# Patient Record
Sex: Male | Born: 1990
Health system: Southern US, Community
[De-identification: ages and names within clinical notes are randomized; demographics above are authoritative.]

## PROBLEM LIST (undated history)

## (undated) DIAGNOSIS — F429 Obsessive-compulsive disorder, unspecified: Secondary | ICD-10-CM

## (undated) DIAGNOSIS — S82209A Unspecified fracture of shaft of unspecified tibia, initial encounter for closed fracture: Secondary | ICD-10-CM

## (undated) DIAGNOSIS — F329 Major depressive disorder, single episode, unspecified: Secondary | ICD-10-CM

## (undated) DIAGNOSIS — F909 Attention-deficit hyperactivity disorder, unspecified type: Secondary | ICD-10-CM

## (undated) DIAGNOSIS — F419 Anxiety disorder, unspecified: Secondary | ICD-10-CM

## (undated) DIAGNOSIS — F319 Bipolar disorder, unspecified: Secondary | ICD-10-CM

## (undated) DIAGNOSIS — F32A Depression, unspecified: Secondary | ICD-10-CM

## (undated) HISTORY — DX: Anxiety disorder, unspecified: F41.9

## (undated) HISTORY — PX: NASAL SEPTUM SURGERY: SHX37

## (undated) HISTORY — DX: Obsessive-compulsive disorder, unspecified: F42.9

## (undated) HISTORY — DX: Bipolar disorder, unspecified: F31.9

## (undated) HISTORY — PX: EYE SURGERY: SHX253

## (undated) HISTORY — PX: APPENDECTOMY: SHX54

## (undated) HISTORY — DX: Depression, unspecified: F32.A

## (undated) HISTORY — DX: Major depressive disorder, single episode, unspecified: F32.9

## (undated) HISTORY — DX: Unspecified fracture of shaft of unspecified tibia, initial encounter for closed fracture: S82.209A

## (undated) HISTORY — DX: Attention-deficit hyperactivity disorder, unspecified type: F90.9

---
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
## 2008-02-12 ENCOUNTER — Telehealth: Payer: Self-pay | Admitting: Family Medicine

## 2008-02-20 ENCOUNTER — Ambulatory Visit: Payer: Self-pay | Admitting: Family Medicine

## 2008-03-11 ENCOUNTER — Encounter: Payer: Self-pay | Admitting: Family Medicine

## 2008-03-12 ENCOUNTER — Encounter: Payer: Self-pay | Admitting: Family Medicine

## 2008-04-07 IMAGING — CR DG FOREARM 2V*L*
2 series · 2 of 2 positions shown · non-contrast
Comparison: none

CLINICAL DATA: Left arm laceration

LEFT FOREARM - 2  VIEW:

[x forearm ap left]
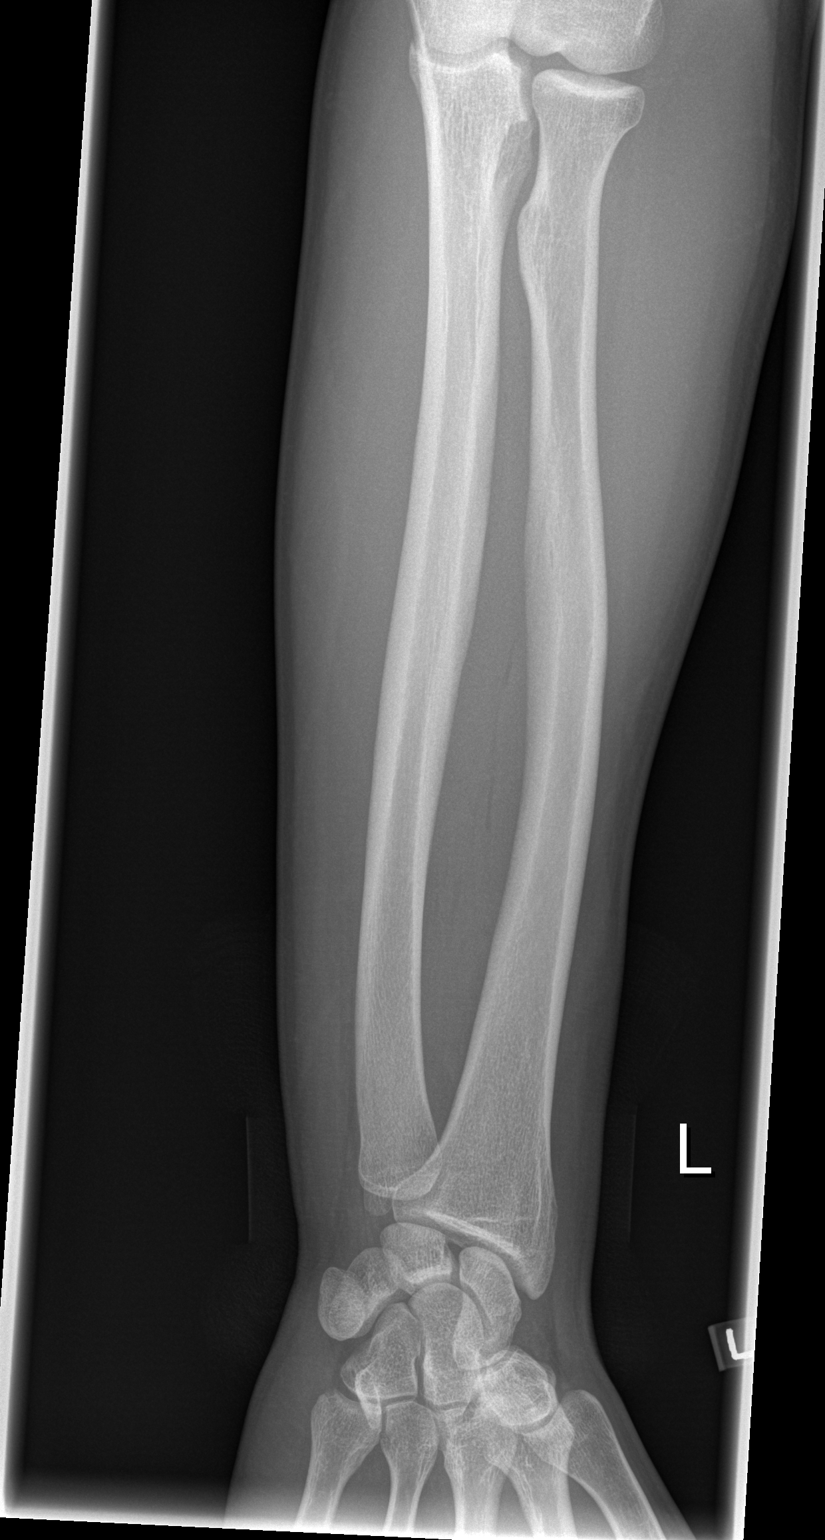

[x forearm lat left]
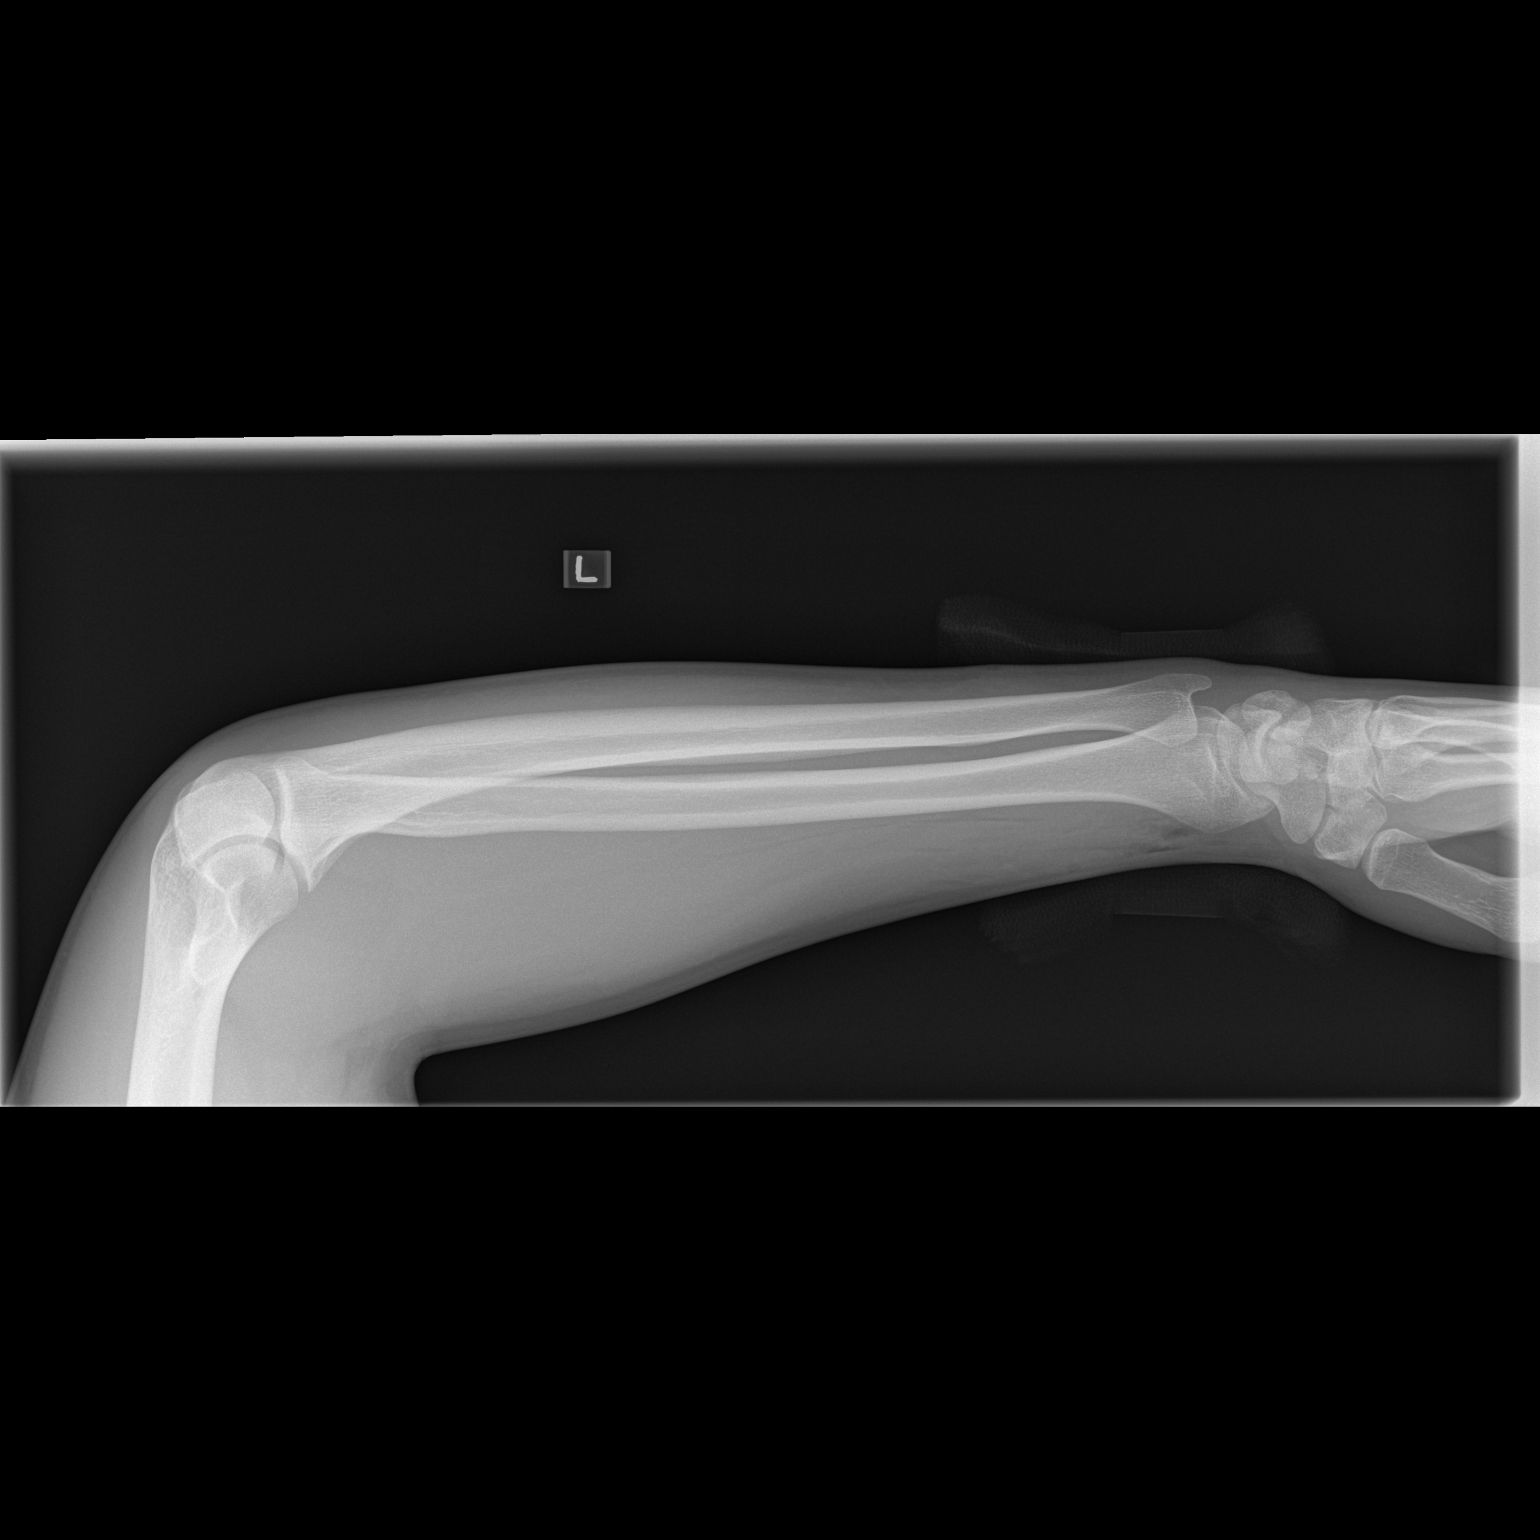

[2 of 2 positions shown; findings below may reference images not displayed]

FINDINGS: There is no evidence of fracture or other focal bone lesions.  Soft
tissues are unremarkable.
IMPRESSION: Negative.

## 2008-05-15 ENCOUNTER — Telehealth: Payer: Self-pay | Admitting: Family Medicine

## 2008-05-20 ENCOUNTER — Ambulatory Visit: Payer: Self-pay | Admitting: Family Medicine

## 2008-05-20 DIAGNOSIS — F528 Other sexual dysfunction not due to a substance or known physiological condition: Secondary | ICD-10-CM | POA: Insufficient documentation

## 2008-09-04 ENCOUNTER — Ambulatory Visit: Payer: Self-pay | Admitting: Family Medicine

## 2009-02-18 ENCOUNTER — Ambulatory Visit (HOSPITAL_BASED_OUTPATIENT_CLINIC_OR_DEPARTMENT_OTHER): Admission: RE | Admit: 2009-02-18 | Discharge: 2009-02-18 | Payer: Self-pay | Admitting: Otolaryngology

## 2009-05-15 ENCOUNTER — Ambulatory Visit: Payer: Self-pay | Admitting: Family Medicine

## 2009-05-15 DIAGNOSIS — R5383 Other fatigue: Secondary | ICD-10-CM

## 2009-05-15 DIAGNOSIS — R5381 Other malaise: Secondary | ICD-10-CM | POA: Insufficient documentation

## 2009-05-16 LAB — CONVERTED CEMR LAB
BUN: 15 mg/dL (ref 6–23)
Basophils Absolute: 0 10*3/uL (ref 0.0–0.1)
Bilirubin, Direct: 0 mg/dL (ref 0.0–0.3)
Chloride: 105 meq/L (ref 96–112)
Creatinine, Ser: 1 mg/dL (ref 0.4–1.5)
EBV VCA IgG: 2.3 — ABNORMAL HIGH
EBV VCA IgM: 0.24
GFR calc non Af Amer: 101.27 mL/min (ref >60)
Glucose, Bld: 81 mg/dL (ref 70–99)
HCT: 42.2 % (ref 39.0–52.0)
Lymphs Abs: 1.9 10*3/uL (ref 0.7–4.0)
MCV: 93.5 fL (ref 78.0–100.0)
Monocytes Absolute: 0.6 10*3/uL (ref 0.1–1.0)
Neutrophils Relative %: 50.4 % (ref 43.0–77.0)
Platelets: 248 10*3/uL (ref 150.0–400.0)
Potassium: 4.3 meq/L (ref 3.5–5.1)
RDW: 12.1 % (ref 11.5–14.6)
TSH: 2.95 microintl units/mL (ref 0.35–5.50)
Total Bilirubin: 0.9 mg/dL (ref 0.3–1.2)

## 2010-09-22 ENCOUNTER — Telehealth: Payer: Self-pay | Admitting: Family Medicine

## 2010-09-24 ENCOUNTER — Ambulatory Visit: Payer: Self-pay | Admitting: Family Medicine

## 2010-11-12 NOTE — Progress Notes (Signed)
  Phone Note Call from Patient Call back at Home Phone [phone]   Caller: Dad Call For: Nelwyn Salisbury MD Summary of Call: Pt had ER appendectomy in [place] on Sep 13, 2010.  Did not  like the level of care and flew home on the 11th.  Still has staples in incision, and wants to know who can take the staples out? Initial call taken by: [facility] CMA AAMA,  September 22, 2010 3:10 PM  Follow-up for Phone Call        he would need to see someone in Surgery here. Please set up a referral ASAP  Follow-up by: Nelwyn Salisbury MD,  September 23, 2010 11:11 AM  New Problems: ENCOUNTER FOR REMOVAL OF SUTURES (ICD-V58.32)   New Problems: ENCOUNTER FOR REMOVAL OF SUTURES (ICD-V58.32)  Appended Document:  Attempted to make contact with pts mom (who called for appt) at [phone]  and  home # for pt  [phone].... Lft msg at both #'s advising them to c/b so that they can be advised to await phone call from Terri, [facility] Referral Coordinator... She will call once surgery appt (for removal of sutures) is scheduled.

## 2011-01-19 LAB — POCT HEMOGLOBIN-HEMACUE: Hemoglobin: 16 g/dL (ref 13.0–17.0)

## 2011-02-23 NOTE — Op Note (Signed)
Mitchell Stone, Mitchell Stone            ACCOUNT NO.:  [id number]   MEDICAL RECORD NO.:  [id number]          PATIENT TYPE:  AMB   LOCATION:  DSC                          FACILITY:  [facility]   PHYSICIAN:  Christopher E. Ezzard Standing, M.D.DATE OF BIRTH:  05-24-1991   DATE OF PROCEDURE:  02/18/2009  DATE OF DISCHARGE:  02/18/2009                               OPERATIVE REPORT   PREOPERATIVE DIAGNOSIS:  Septal deviation to the right with right-sided  nasal obstruction, turbinate hypertrophy.   POSTOPERATIVE DIAGNOSIS:  Septal deviation to the right with right-sided  nasal obstruction, turbinate hypertrophy.   OPERATION PERFORMED:  Septoplasty with bilateral inferior turbinate  reductions.   SURGEON:  Kristine Garbe. Ezzard Standing, MD   ANESTHESIA:  General endotracheal.   COMPLICATIONS:  None.   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  On examination in the office, he has a fairly severe right-sided  anterior cartilaginous septal deviation with almost total blockage of  the right nasal airway.  He had moderate turbinate hypertrophy.  He also  has a small subtle deformity of the nose anteriorly over the  cartilaginous septum.  Nasal bones are fairly well lined, but may be  slightly depressed.  His main problem really seems to the nasal  obstruction and was taken to operating room at this time for septoplasty  and turbinate ductions to help improve his right nasal airway.   DESCRIPTION OF PROCEDURE:  After adequate endotracheal anesthesia, nose  was prepped with Betadine solution and draped with sterile towels.  The  nose was then further prepped with cotton pledgets soaked in Afrin and  septum and turbinates were injected with Xylocaine with epinephrine.  Hemitransfixion incision was made along the caudal edge of the septum  on  the right side.  Mucoperiosteal flaps were elevated posteriorly.  He has  a fair amount of scar tissue on the right side where the septum actually  bowed off the crest into the right airway and was buckled with the  surrounding scar tissue.  The cartilaginous septum was almost touched  the inferior turbinate on the right side.  At this area, the  mucoperichondrium was elevated off either side of the severely deviated  cartilaginous septum.  The anterior 2 cm of septum was preserved and  this cartilaginous septum was removed.  More superiorly, the bony septum  of the vomer also protruded to the right side and after elevating  mucoperichondrium on either side, the bony superior septum which was  thickened and deviated to right was removed with Jansen-Middleton  forceps.  This significantly improve the right airway.  At completion of  the septoplasty, inferior turbinate reductions were performed.  On the  right side, inferior lateral one half of turbinate was amputated and  remaining turbinate tissue was outfractured and cauterized.  On the left  side, incision was made along the inferior edge of the septum and  submucosal resection of bone was performed and  the submucosal  cauterization was performed and then the remaining posterior turbinate  was outfractured.  This completed the procedure.  The hemitransfixion  incision was closed with interrupted 4-0 chromic suture.  The septum was  basted with a 4-0 chromic suture and then splints were secured at either  side of the septum with a single 3-0 nylon suture.  Nose was packed with  Telfa soaked in bacitracin ointment.  Mitchell Stone was then awoke from  anesthesia and transferred to the recovery room and postop doing well.   DISPOSITION:  Mitchell Stone is discharged home later this morning on Keflex 500  mg b.i.d. for 10 days, Tylenol and Vicodin p.r.n. pain.  We will have  him follow up in my office in 2 days to have nasal packs removed.            ______________________________  Kristine Garbe Ezzard Standing, M.D.     CEN/MEDQ  D:  02/18/2009  T:  02/19/2009  Job:  [id number]

## 2011-02-26 NOTE — Assessment & Plan Note (Signed)
[facility] OFFICE NOTE   Mitchell Stone, Mitchell Stone                     MRN:          [id number]  DATE:08/24/2006                            DOB:          1991/04/23    This is a 20 year old gentleman here with his father to establish with  our practice.  He is complaining of an upper respiratory infection.  For  the past two weeks, he has had stuffy head, sinus pressure, postnasal  drainage, chest congestion and a dry cough.  No shortness of breath and  no fever.  He has been drinking fluids and using over-the-counter cough  medicines.   PAST MEDICAL HISTORY:  He had seen Dr. Ermalinda Barrios for pediatric  care before transferring to [place].  He had an unremarkable birth history.  He has never had a surgery.  His only trauma was a fracture to his right  tibia, which healed with simple casting.  He has been seeing Dr.  Marlyne Beards for the past three years for obsessive-compulsive disorder, and  apparently he is doing quite well on his current medication.   IMMUNIZATIONS:  Up to date.  He did have a tetanus booster in 2005.   HABITS:  He denies using tobacco or alcohol.   SOCIAL HISTORY:  He lives with his parents and his two sisters.  He is  in the 11th grade at [employer].   FAMILY HISTORY:  Remarkable for heart disease and asthma.   OBJECTIVE:  VITAL SIGNS:  Height 6 feet 1/2 inch.  Weight 183.  BP  132/74, pulse 76 and regular, temperature 97.5 degrees.  GENERAL:  He is in no acute distress.  HEENT:  Eyes clear.  Ears clear.  Pharynx clear.  NECK:  Supple without lymphadenopathy or masses.  LUNGS:  Clear.   ASSESSMENT/PLAN:  Bronchitis.   Biaxin XL 500 mg 2 tablets a day for 10 days plus Robitussin-DM as  needed.     Tera Mater. Clent Ridges, MD  Electronically Signed    SAF/MedQ  DD: 08/25/2006  DT: 08/25/2006  Job #: [id number]

## 2011-03-15 ENCOUNTER — Ambulatory Visit (INDEPENDENT_AMBULATORY_CARE_PROVIDER_SITE_OTHER): Payer: BC Managed Care – PPO | Admitting: Family Medicine

## 2011-03-15 ENCOUNTER — Encounter: Payer: Self-pay | Admitting: Family Medicine

## 2011-03-15 VITALS — BP 122/80 | HR 76 | Temp 98.4°F | Ht 74.5 in | Wt 187.0 lb

## 2011-03-15 DIAGNOSIS — A088 Other specified intestinal infections: Secondary | ICD-10-CM

## 2011-03-15 DIAGNOSIS — A084 Viral intestinal infection, unspecified: Secondary | ICD-10-CM

## 2011-03-15 NOTE — Progress Notes (Signed)
  Subjective:    Patient ID: Mitchell Stone, male    DOB: November 28, 1990, 20 y.o.   MRN: [id number]  HPI Here for 2 days of mild abdominal cramps and diarrhea. No fever. No nausea or vomiting. No urinary symptoms. No recent antibiotic use, no recent travel.    Review of Systems  Constitutional: Negative.   Respiratory: Negative.   Cardiovascular: Negative.   Gastrointestinal: Positive for abdominal pain and diarrhea. Negative for nausea, vomiting, constipation, blood in stool, abdominal distention, anal bleeding and rectal pain.  Genitourinary: Negative.        Objective:   Physical Exam  Constitutional: He appears well-developed and well-nourished.  Cardiovascular: Normal rate, regular rhythm, normal heart sounds and intact distal pulses.   Pulmonary/Chest: Effort normal and breath sounds normal.  Abdominal: Soft. Bowel sounds are normal. He exhibits no distension and no mass. There is no tenderness. There is no rebound and no guarding.          Assessment & Plan:  This appears to be viral. Drink water and Gatorade. Try Imodium AD prn .

## 2011-08-16 ENCOUNTER — Encounter: Payer: Self-pay | Admitting: Internal Medicine

## 2011-09-13 ENCOUNTER — Encounter: Payer: Self-pay | Admitting: Internal Medicine

## 2011-09-14 ENCOUNTER — Telehealth: Payer: Self-pay | Admitting: Family Medicine

## 2011-09-14 NOTE — Telephone Encounter (Signed)
Pt is experiencing Nausea, fever, loss of appetite, fatigue,pain in right side, and a sore throat. Pt is scheduled for Thursday but would like to be worked in sooner .please advise

## 2011-09-14 NOTE — Telephone Encounter (Signed)
If we have something, please work in.

## 2011-09-15 ENCOUNTER — Encounter: Payer: Self-pay | Admitting: Family Medicine

## 2011-09-15 ENCOUNTER — Ambulatory Visit (INDEPENDENT_AMBULATORY_CARE_PROVIDER_SITE_OTHER): Payer: BC Managed Care – PPO | Admitting: Family Medicine

## 2011-09-15 VITALS — BP 102/64 | HR 95 | Temp 97.8°F

## 2011-09-15 DIAGNOSIS — R109 Unspecified abdominal pain: Secondary | ICD-10-CM

## 2011-09-15 DIAGNOSIS — Z202 Contact with and (suspected) exposure to infections with a predominantly sexual mode of transmission: Secondary | ICD-10-CM

## 2011-09-15 LAB — CBC WITH DIFFERENTIAL/PLATELET
Basophils Relative: 0.4 % (ref 0.0–3.0)
Eosinophils Relative: 9.2 % — ABNORMAL HIGH (ref 0.0–5.0)
HCT: 42.5 % (ref 39.0–52.0)
Hemoglobin: 14.6 g/dL (ref 13.0–17.0)
MCV: 96.5 fl (ref 78.0–100.0)
Monocytes Absolute: 0.5 10*3/uL (ref 0.1–1.0)
Neutrophils Relative %: 49.1 % (ref 43.0–77.0)
RBC: 4.41 Mil/uL (ref 4.22–5.81)
WBC: 5.7 10*3/uL (ref 4.5–10.5)

## 2011-09-15 LAB — BASIC METABOLIC PANEL
Calcium: 9.3 mg/dL (ref 8.4–10.5)
GFR: 92.58 mL/min (ref >60.00)
Sodium: 140 mEq/L (ref 135–145)

## 2011-09-15 LAB — HEPATIC FUNCTION PANEL
Albumin: 4.4 g/dL (ref 3.5–5.2)
Alkaline Phosphatase: 78 U/L (ref 39–117)
Bilirubin, Direct: 0 mg/dL (ref 0.0–0.3)
Total Bilirubin: 0.5 mg/dL (ref 0.3–1.2)

## 2011-09-15 LAB — AMYLASE: Amylase: 47 U/L (ref 27–131)

## 2011-09-15 MED ORDER — ONDANSETRON 8 MG PO TBDP: 8.0000 mg | ORAL_TABLET | Freq: Four times a day (QID) | ORAL | Status: AC | PRN

## 2011-09-15 NOTE — Progress Notes (Signed)
  Subjective:    Patient ID: Mitchell Stone, male    DOB: 08-19-1991, 20 y.o.   MRN: [id number]  HPI Here for 2 days of nausea, vomiting, diarrhea, and RUQ pains. No fever or jaundice. His girlfriend is just getting over a case of Hepatitis A. Also he asks for STD tests. He has no symptoms but is worried.    Review of Systems  Constitutional: Negative.   Respiratory: Negative.   Cardiovascular: Negative.   Gastrointestinal: Positive for nausea, vomiting, abdominal pain and diarrhea. Negative for constipation, blood in stool and abdominal distention.       Objective:   Physical Exam  Constitutional: He appears well-developed and well-nourished.  HENT:       No jaundice seen   Abdominal: Soft. Bowel sounds are normal. He exhibits no distension and no mass. There is tenderness. There is no rebound and no guarding.       Tender over the RUQ           Assessment & Plan:  He could be showing early signs of hepatitis, so we will get an acute hepatitis panel with some other labs. Drink fluids. Use Zofran and Imodium prn

## 2011-09-16 ENCOUNTER — Telehealth: Payer: Self-pay | Admitting: Family Medicine

## 2011-09-16 ENCOUNTER — Ambulatory Visit: Payer: BC Managed Care – PPO | Admitting: Family Medicine

## 2011-09-16 LAB — RPR

## 2011-09-16 NOTE — Telephone Encounter (Signed)
Please contact pt. With lab results//father was trying to get them but he is not listed on [employer].

## 2011-09-17 ENCOUNTER — Ambulatory Visit (INDEPENDENT_AMBULATORY_CARE_PROVIDER_SITE_OTHER): Payer: BC Managed Care – PPO | Admitting: Internal Medicine

## 2011-09-17 ENCOUNTER — Other Ambulatory Visit (INDEPENDENT_AMBULATORY_CARE_PROVIDER_SITE_OTHER): Payer: BC Managed Care – PPO

## 2011-09-17 ENCOUNTER — Encounter: Payer: Self-pay | Admitting: Internal Medicine

## 2011-09-17 VITALS — BP 116/78 | HR 88 | Ht 74.0 in | Wt 181.0 lb

## 2011-09-17 DIAGNOSIS — R634 Abnormal weight loss: Secondary | ICD-10-CM

## 2011-09-17 DIAGNOSIS — R1084 Generalized abdominal pain: Secondary | ICD-10-CM

## 2011-09-17 DIAGNOSIS — R197 Diarrhea, unspecified: Secondary | ICD-10-CM

## 2011-09-17 LAB — HIGH SENSITIVITY CRP: CRP, High Sensitivity: <1 mg/L (ref 0.000–5.000)

## 2011-09-17 LAB — HEPATITIS PANEL, ACUTE
Hep B C IgM: NEGATIVE
Hepatitis B Surface Ag: NEGATIVE

## 2011-09-17 NOTE — Patient Instructions (Signed)
Your physician has requested that you go to the basement for the following lab work before leaving today: labs and hemoccult testing

## 2011-09-17 NOTE — Telephone Encounter (Signed)
Spoke with pt and gave results. 

## 2011-09-17 NOTE — Progress Notes (Signed)
HISTORY OF PRESENT ILLNESS:  Mitchell Stone is a 20 y.o. male with significant psychiatric problems who presents today regarding gastrointestinal complaints. He is accompanied by his mother. His father Mitchell Stone, and siblings are patients of mine. 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 He has had to withdraw from school due to medical issues. He states that he underwent appendectomy 09/13/2010. There were told that there was a "benign tumor" removed. No details. Since that time he states that he has had decreased appetite. As well 2530 pound weight loss. He has been on different psychiatric medications with variable compliance. He reports to me the diagnoses of OCD, ADHD, bipolar disorder, and anxiety. He also reports some lower bowel discomfort which is vague. Loose stools 2-3 times per week. Occasional blood on the tissue with wiping. Apparently has had prior colonoscopy and possibly upper endoscopy with Dr. Dorena Cookey, in [place]. Records unavailable. His mother is concerned because her brother, with similar psychiatric issues, has ulcerative colitis. Patient saw his primary provider yesterday and underwent extensive blood work testing, all of which was negative or normal. I have reviewed these personally and with the patient and his mother.  REVIEW OF SYSTEMS:  All non-GI ROS negative except for fatigue  Past Medical History  Diagnosis Date  . Tibia fracture     right  . OCD (obsessive compulsive disorder)     Past Surgical History  Procedure Date  . Appendectomy   . Eye surgery   . Nasal septum surgery     Social History Mitchell Stone  reports that he has been smoking Cigarettes.  He has a 2.5 pack-year smoking history. He has never used smokeless tobacco. He reports that he drinks about one ounce of alcohol per week. He reports that he does not use illicit drugs.  family history includes Asthma in an unspecified family member; Breast cancer in his maternal grandmother  and paternal grandmother; Colon polyps in his maternal grandmother; Coronary artery disease in his mother; Heart disease in his paternal grandfather; Liver cancer in his maternal grandfather; Pancreatic cancer in his paternal grandfather; and Ulcerative colitis in his maternal uncle.  Allergies  Allergen Reactions  . Demerol        PHYSICAL EXAMINATION: Vital signs: BP 116/78  Pulse 88  Ht 6\' 2"  (1.88 m)  Wt 181 lb (82.101 kg)  BMI 23.24 kg/m2 General: Well-developed, well-nourished, no acute distress HEENT: Sclerae are anicteric, conjunctiva pink. Oral mucosa intact Lungs: Clear Heart: Regular Abdomen: soft, nontender, nondistended, no obvious ascites, no peritoneal signs, normal bowel sounds. No organomegaly. Extremities: No edema Psychiatric: alert and oriented x3. Cooperative but hyperactive    ASSESSMENT:  #1. No obvious primary GI problem. I suspect that his change in appetite and weight loss are related to his underlying psychiatric issues. We discussed this.  PLAN:  #1. C-reactive protein and celiac sprue screen with tissue transglutaminase antibody #2. Hemoccult studies #3. Obtain outside records from [place] and [place] for my review #4. If the above unremarkable, then he should continue to work closely with his psychiatrist, as improvement in his psychiatric issues will likely improve his gastrointestinal complaints. #5. GI followup when necessary

## 2011-09-17 NOTE — Progress Notes (Signed)
Quick Note:  Spoke with pt ______ 

## 2011-09-20 ENCOUNTER — Telehealth: Payer: Self-pay

## 2011-09-20 NOTE — Telephone Encounter (Signed)
Pt aware.

## 2011-09-20 NOTE — Telephone Encounter (Signed)
Pt aware of results per Dr. Perry. 

## 2011-09-20 NOTE — Telephone Encounter (Signed)
Message copied by Michele Mcalpine on Mon Sep 20, 2011  3:33 PM ------      Message from: Hilarie Fredrickson      Created: Mon Sep 20, 2011  3:30 PM       Please let the patient know that his testing for celiac disease as well as inflammation testing were both normal.

## 2012-04-25 ENCOUNTER — Telehealth: Payer: Self-pay | Admitting: Internal Medicine

## 2012-04-25 NOTE — Telephone Encounter (Signed)
Spoke with pt and let him know we could see him for the rectal bleeding but he would have to contact his PCP or a urologist for the blood from his penis. Pt states he will just call his PCP. Instructed pt to call back if he needed to.

## 2012-04-26 ENCOUNTER — Ambulatory Visit (INDEPENDENT_AMBULATORY_CARE_PROVIDER_SITE_OTHER): Payer: BC Managed Care – PPO | Admitting: Family Medicine

## 2012-04-26 ENCOUNTER — Encounter: Payer: Self-pay | Admitting: Family Medicine

## 2012-04-26 VITALS — BP 106/66 | HR 111 | Temp 98.5°F | Wt 184.0 lb

## 2012-04-26 DIAGNOSIS — K602 Anal fissure, unspecified: Secondary | ICD-10-CM

## 2012-04-26 DIAGNOSIS — N481 Balanitis: Secondary | ICD-10-CM

## 2012-04-26 DIAGNOSIS — N476 Balanoposthitis: Secondary | ICD-10-CM

## 2012-04-26 NOTE — Progress Notes (Signed)
  Subjective:    Patient ID: Mitchell Stone, male    DOB: 03/17/1991, 21 y.o.   MRN: [id number]  HPI Here for 2 things. First yesterday after passing a large painful stool he noticed some blood on the paper when he wiped. No bleeding since then. Usually his BMs are easy to pass. He drinks plenty of water daily. No abdominal pain. Also several days ago he noticed some redness around the tip of the penis. No pain, no urinary problems, no DC.    Review of Systems  Constitutional: Negative.   Gastrointestinal: Negative for nausea, vomiting, abdominal pain, diarrhea, constipation, blood in stool, abdominal distention and rectal pain.  Genitourinary: Negative.        Objective:   Physical Exam  Constitutional: He appears well-developed and well-nourished.  Abdominal: Soft. Bowel sounds are normal. He exhibits no distension and no mass. There is no tenderness. There is no rebound and no guarding.  Genitourinary: Testes normal and penis normal. Right testis shows no mass and no tenderness. Left testis shows no mass and no tenderness. No phimosis, paraphimosis, hypospadias, penile erythema or penile tenderness. No discharge found.       Small healing anal fissure           Assessment & Plan:  He had an anal fissure, and this is likely the source of blood seen when he wiped. Suggested he get more fiber in his diet, possibly use Metamucil daily. His penis appears to be normal today. He will recheck prn

## 2012-09-26 ENCOUNTER — Institutional Professional Consult (permissible substitution): Payer: BC Managed Care – PPO | Admitting: Pulmonary Disease

## 2012-09-29 ENCOUNTER — Telehealth: Payer: Self-pay | Admitting: Pulmonary Disease

## 2012-09-29 NOTE — Telephone Encounter (Signed)
I spoke wtih the pt and offered to bring him in to see CY on 10/09/12 and that this would be the first available consult slot with any of our sleep providers before his appt that is already scheduled with [facility] on 10/12/12. Pt verbalized understanding and said he would keep his appt with KC on 10/12/12.

## 2012-10-12 ENCOUNTER — Ambulatory Visit (INDEPENDENT_AMBULATORY_CARE_PROVIDER_SITE_OTHER): Payer: BC Managed Care – PPO | Admitting: Pulmonary Disease

## 2012-10-12 ENCOUNTER — Encounter: Payer: Self-pay | Admitting: Pulmonary Disease

## 2012-10-12 VITALS — BP 128/88 | HR 79 | Temp 98.1°F | Ht 74.0 in | Wt 180.4 lb

## 2012-10-12 DIAGNOSIS — G4700 Insomnia, unspecified: Secondary | ICD-10-CM

## 2012-10-12 NOTE — Progress Notes (Signed)
  Subjective:    Patient ID: Mitchell Stone, male    DOB: [birth year], 22 y.o.   MRN: [id number]  HPI The patient is a 22 year old male who comes in today as a self-referral for evaluation of insomnia.  The patient states he has had sleep issues over the last one year that started spontaneously.  He has no particular sleep pattern, and states that he may go 2-3 days without sleeping.  He will often sleep on the sofa in the living room rather than in his bedroom.  He will often lie in bed for 5-6 hours without falling asleep, and blames this on his OCD causing his "mind to race".  He also has a history of ADHD, anxiety, as well as depression.  He is followed by a psychiatrist, as well as a Warden/ranger both here and also at his school in [place].  He has not seen his psychologist in over a year.  Even if he will get to sleep, is usually for no more than 1-2 hours, and then cannot return to sleep.  He does not watch TV in bed, but does read in bed.  He admits to having a sense of frustration about going to sleep.  He does not sleep at all during the day, nor does he use caffeine.  He denies any neurologic changes, visual changes, or chronic headaches.  He has not had a recent head CT.  It should be noted that he has not taken his Adderall Klonopin for at least a month now.   Review of Systems  Constitutional: Negative for fever and unexpected weight change.  HENT: Negative for ear pain, nosebleeds, congestion, sore throat, rhinorrhea, sneezing, trouble swallowing, dental problem, postnasal drip and sinus pressure.   Eyes: Negative for redness and itching.  Respiratory: Negative for cough, chest tightness, shortness of breath and wheezing.   Cardiovascular: Negative for palpitations and leg swelling.  Gastrointestinal: Negative for nausea and vomiting.  Genitourinary: Negative for dysuria.  Musculoskeletal: Negative for joint swelling.  Skin: Negative for rash.  Neurological: Negative for headaches.    Hematological: Does not bruise/bleed easily.  Psychiatric/Behavioral: Positive for sleep disturbance ( patient states hard time falling to sleep and qlaity of sleep is poor. Pt c/o smoring as well, has been told that he sounds like he is gasping). Negative for dysphoric mood. The patient is nervous/anxious ( treated with Klonopin).        Objective:   Physical Exam Constitutional:  Well developed, no acute distress  HENT:  Nares patent without discharge  Oropharynx without exudate, palate and uvula are normal  Eyes:  Perrla, eomi, no scleral icterus  Neck:  No JVD, no TMG  Cardiovascular:  Normal rate, regular rhythm, no rubs or gallops.  No murmurs        Intact distal pulses  Pulmonary :  Normal breath sounds, no stridor or respiratory distress   No rales, rhonchi, or wheezing  Abdominal:  Soft, nondistended, bowel sounds present.  No tenderness noted.   Musculoskeletal:  No lower extremity edema noted.  Lymph Nodes:  No cervical lymphadenopathy noted  Skin:  No cyanosis noted  Neurologic:  Alert, appropriate, moves all 4 extremities without obvious deficit.         Assessment & Plan:

## 2012-10-12 NOTE — Assessment & Plan Note (Signed)
I suspect the patient has multifactorial insomnia, and related to psychophysiologic insomnia, inadequate sleep hygiene, underlying psychiatric disorders, and a self-induced advanced sleep phase syndrome related to college life.  I have had a long discussion with him about how to approach this.  I have reviewed stimulus control therapy and also sleep restriction therapy, as well as what constitutes good sleep hygiene.  I have stressed to him the importance of getting back with his psychologist at school to consider cognitive behavioral therapy.  He tells me that he will do this.  My only other thought is that if he continues to have issues with this, I would consider a CT scan of his head for completeness.  The patient has noticed a sudden onset of this issue over the last one year, and has also had some personality changes as well.  He denies any neurologic symptoms, chronic headaches, or visual changes.

## 2012-10-12 NOTE — Patient Instructions (Addendum)
You need to get back with your psychologist at school for CBT (cognitive behavioral therapy). Would start out making your bedtime around 2am, and do not sleep any sooner than this.  If your sleep begins to improve, you can move bedtime backwards by every few weeks. Can try taking melatonin 3mg  about 4 hrs before bedtime. No tv or reading in bedroom.  It would also be helpful if you could limit time in bedroom to sleeping only Do not stay in bed if you cannot fall asleep within .  Go to living room and either read or watch tv with low light until you get sleepy.  Then try to go back to bedroom.  If you cannot fall asleep in , start the process all over.  Do this as many times as it takes until you fall asleep or it gets to be 8am.  You need to be out of bed everyday by no later than 8am. No eating or drinking during the middle of the night, no computer or video games after 10pm if you can avoid. No napping during the day. If you are seeing no improvement over the next 8 weeks, would consider a scan of your head to make sure nothing else is going on. Please call me in 3 weeks to let me know about your progress.

## 2013-01-19 ENCOUNTER — Telehealth: Payer: Self-pay | Admitting: Pulmonary Disease

## 2013-01-19 NOTE — Telephone Encounter (Signed)
Spoke with patient, made him aware of recs as listed below. Patient stated they would look in to this. Nothing further needed at this time.

## 2013-01-19 NOTE — Telephone Encounter (Signed)
He needs cognitive behavioral therapy with either a psychologist or psychiatrist who does this type of therapy.  If he sees someone in [place], they need to discuss with them CBT.  If he does not have a psychiatrist in [place], his primary can make a recommendation.  The other option is to be referred to sleep center at [facility] where they can do CBT.

## 2013-01-19 NOTE — Telephone Encounter (Signed)
Pt was seen by [facility] on 10/12/12:  Patient Instructions    You need to get back with your psychologist at school for CBT (cognitive behavioral therapy).  Would start out making your bedtime around 2am, and do not sleep any sooner than this. If your sleep begins to improve, you can move bedtime backwards by every few weeks.  Can try taking melatonin 3mg  about 4 hrs before bedtime.  No tv or reading in bedroom. It would also be helpful if you could limit time in bedroom to sleeping only  Do not stay in bed if you cannot fall asleep within . Go to living room and either read or watch tv with low light until you get sleepy. Then try to go back to bedroom. If you cannot fall asleep in , start the process all over. Do this as many times as it takes until you fall asleep or it gets to be 8am. You need to be out of bed everyday by no later than 8am.  No eating or drinking during the middle of the night, no computer or video games after 10pm if you can avoid.  No napping during the day.  If you are seeing no improvement over the next 8 weeks, would consider a scan of your head to make sure nothing else is going on.  Please call me in 3 weeks to let me know about your progress.   ------  Called, spoke with pt's father.  Reports pt has been following instructions by [facility] but this isn't working.  States he is staying up 2-3 days at a time.  Pt is coming home from college around May 5-6.  He would like to have pt sent to sleep clinic to find out what is going on.  KC, pls advise.  Thank you.

## 2013-02-01 ENCOUNTER — Encounter: Payer: Self-pay | Admitting: Family Medicine

## 2013-02-01 ENCOUNTER — Ambulatory Visit (INDEPENDENT_AMBULATORY_CARE_PROVIDER_SITE_OTHER): Payer: BC Managed Care – PPO | Admitting: Family Medicine

## 2013-02-01 VITALS — BP 126/72 | HR 115 | Temp 98.2°F | Wt 178.0 lb

## 2013-02-01 DIAGNOSIS — Z202 Contact with and (suspected) exposure to infections with a predominantly sexual mode of transmission: Secondary | ICD-10-CM

## 2013-02-01 DIAGNOSIS — N50819 Testicular pain, unspecified: Secondary | ICD-10-CM

## 2013-02-01 DIAGNOSIS — N509 Disorder of male genital organs, unspecified: Secondary | ICD-10-CM

## 2013-02-01 DIAGNOSIS — Z2089 Contact with and (suspected) exposure to other communicable diseases: Secondary | ICD-10-CM

## 2013-02-01 LAB — BASIC METABOLIC PANEL
BUN: 11 mg/dL (ref 6–23)
CO2: 29 mEq/L (ref 19–32)
Chloride: 100 mEq/L (ref 96–112)
Creatinine, Ser: 1 mg/dL (ref 0.4–1.5)

## 2013-02-01 LAB — CBC WITH DIFFERENTIAL/PLATELET
Basophils Absolute: 0 10*3/uL (ref 0.0–0.1)
Lymphocytes Relative: 33.4 % (ref 12.0–46.0)
Lymphs Abs: 1.8 10*3/uL (ref 0.7–4.0)
Monocytes Relative: 9.5 % (ref 3.0–12.0)
Platelets: 285 10*3/uL (ref 150.0–400.0)
RDW: 12.4 % (ref 11.5–14.6)

## 2013-02-01 LAB — TSH: TSH: 3.67 u[IU]/mL (ref 0.35–5.50)

## 2013-02-01 LAB — POCT URINALYSIS DIPSTICK
Leukocytes, UA: NEGATIVE
Protein, UA: NEGATIVE
Urobilinogen, UA: 0.2

## 2013-02-01 LAB — HEPATIC FUNCTION PANEL
ALT: 17 U/L (ref 0–53)
Albumin: 4.5 g/dL (ref 3.5–5.2)
Total Protein: 7.4 g/dL (ref 6.0–8.3)

## 2013-02-01 NOTE — Progress Notes (Signed)
  Subjective:    Patient ID: Mitchell Stone, male    DOB: September 21, 1991, 22 y.o.   MRN: [id number]  HPI Here for several things. He got home from college last night and he will spend the summer at home. He has had one week of mild intermittent discomfort in the right testicle. No swelling. No recent trauma. No urethral DC. Also he wants to be checked for STDs. He has no symptoms but had a number of sexual experiences recently and he wants some piece of mind.    Review of Systems  Constitutional: Negative.   Genitourinary: Positive for testicular pain. Negative for dysuria, frequency, hematuria, flank pain, discharge, genital sores and penile pain.       Objective:   Physical Exam  Constitutional: He appears well-developed and well-nourished.  Abdominal: Soft. Bowel sounds are normal. He exhibits no distension and no mass. There is no tenderness. There is no rebound and no guarding.  Genitourinary: Penis normal.  Normal testicles          Assessment & Plan:  His testicles seem to be fine today. He will return if they start bothering him again. Get labs

## 2013-02-02 LAB — HIV ANTIBODY (ROUTINE TESTING W REFLEX): HIV: NONREACTIVE

## 2013-02-02 LAB — CHLAMYDIA PROBE AMPLIFICATION, URINE: Chlamydia, Swab/Urine, PCR: NEGATIVE

## 2013-02-02 LAB — GC PROBE AMPLIFICATION, URINE: GC Probe Amp, Urine: NEGATIVE

## 2013-02-05 NOTE — Progress Notes (Signed)
Quick Note:  I spoke with pt ______ 

## 2013-06-04 ENCOUNTER — Telehealth: Payer: Self-pay | Admitting: Family Medicine

## 2013-06-04 NOTE — Telephone Encounter (Signed)
PT is inquiring about his labs from 02/01/13. He wants to know if we tested for the level of metal in his blood. He would like extensive details of EVERYTHING that they tested. Please assist.

## 2013-06-05 NOTE — Telephone Encounter (Signed)
He can review all the results in My Chart. We did not check for any "metals" in his blood

## 2013-06-06 ENCOUNTER — Ambulatory Visit (INDEPENDENT_AMBULATORY_CARE_PROVIDER_SITE_OTHER): Payer: BC Managed Care – PPO | Admitting: Family Medicine

## 2013-06-06 ENCOUNTER — Encounter: Payer: Self-pay | Admitting: Family Medicine

## 2013-06-06 VITALS — BP 122/84 | HR 132 | Temp 98.4°F | Wt 186.0 lb

## 2013-06-06 DIAGNOSIS — Z2089 Contact with and (suspected) exposure to other communicable diseases: Secondary | ICD-10-CM

## 2013-06-06 DIAGNOSIS — F429 Obsessive-compulsive disorder, unspecified: Secondary | ICD-10-CM

## 2013-06-06 DIAGNOSIS — L509 Urticaria, unspecified: Secondary | ICD-10-CM

## 2013-06-06 DIAGNOSIS — F909 Attention-deficit hyperactivity disorder, unspecified type: Secondary | ICD-10-CM

## 2013-06-06 DIAGNOSIS — Z202 Contact with and (suspected) exposure to infections with a predominantly sexual mode of transmission: Secondary | ICD-10-CM

## 2013-06-06 MED ORDER — CLONAZEPAM 1 MG PO TABS: 1.0000 mg | ORAL_TABLET | Freq: Two times a day (BID) | ORAL | Status: DC | PRN

## 2013-06-06 MED ORDER — LISDEXAMFETAMINE DIMESYLATE 50 MG PO CAPS: 50.0000 mg | ORAL_CAPSULE | ORAL | Status: DC

## 2013-06-06 NOTE — Telephone Encounter (Signed)
I spoke with pt  

## 2013-06-06 NOTE — Progress Notes (Signed)
  Subjective:    Patient ID: Mitchell Stone, male    DOB: 1991-08-09, 22 y.o.   MRN: [id number]  HPI Here to check a rash that comes and goes on his hands for the past 2 days. They do not itch and have no other sx. He has been under a lot of stess lately. He has a hx of anxiety, OCD, and ADHD. He has seen Dr. Nolen Mu 3 times over the past few months, and she had planned to wean him off Clonazepam and Vyvanse. He went from Clonazepam tid to once a day but began to feel very shaky and nervous. Then he stopped the Clonazepam completely a few days ago, and he has not felt well. The rash on his hands comes and goes. He asks if he can get these meds from [place] and not go back to Dr. Nolen Mu.    Review of Systems  Constitutional: Negative.   Respiratory: Negative.   Cardiovascular: Negative.   Skin: Positive for rash.  Psychiatric/Behavioral: Positive for agitation. Negative for confusion, dysphoric mood and decreased concentration. The patient is nervous/anxious.        Objective:   Physical Exam  Constitutional: He appears well-developed and well-nourished.  Cardiovascular: Normal rate, regular rhythm, normal heart sounds and intact distal pulses.   Pulmonary/Chest: Effort normal and breath sounds normal.  Skin:  The palms of both hands are mottled in color but have no raised lesions  Psychiatric: His behavior is normal. Thought content normal.  Anxious but appropriate           Assessment & Plan:  He seems to be having a hives reaction in the hands, probably a reaction to his stress levels. We agreed to treat him here and we refilled Clonazepam and Vyvanse. Screen for STDs.

## 2013-06-07 LAB — HIV ANTIBODY (ROUTINE TESTING W REFLEX): HIV: NONREACTIVE

## 2013-06-07 LAB — HEPATIC FUNCTION PANEL
AST: 20 U/L (ref 0–37)
Albumin: 4.6 g/dL (ref 3.5–5.2)
Total Protein: 7.7 g/dL (ref 6.0–8.3)

## 2013-06-07 LAB — GC/CHLAMYDIA PROBE AMP, URINE: GC Probe Amp, Urine: NEGATIVE

## 2013-06-08 ENCOUNTER — Telehealth: Payer: Self-pay | Admitting: Family Medicine

## 2013-06-08 NOTE — Telephone Encounter (Signed)
Pt would like blood work results °

## 2013-06-08 NOTE — Telephone Encounter (Signed)
I spoke with pt and gave results.  

## 2013-06-08 NOTE — Telephone Encounter (Signed)
See report

## 2013-06-08 NOTE — Progress Notes (Signed)
Quick Note:  I spoke with pt ______ 

## 2013-06-26 ENCOUNTER — Other Ambulatory Visit: Payer: BC Managed Care – PPO

## 2013-06-28 ENCOUNTER — Telehealth: Payer: Self-pay | Admitting: Family Medicine

## 2013-06-28 NOTE — Telephone Encounter (Signed)
Pt needs referral to sleep clinic at [facility]. Pt does not want to got to [place]. Pt has not sleep maybe 10 hours in the past few days. pls call asap.

## 2013-06-28 NOTE — Telephone Encounter (Signed)
I can assist with this, but he needs to figure out who he wants to see there. I don't know if this would be Neurology or Pulmonary or who ever. He can Microbiologist and let me know

## 2013-06-29 NOTE — Telephone Encounter (Signed)
I tried again to reach pt, no answer and mail box was full.

## 2013-06-29 NOTE — Telephone Encounter (Signed)
I tried to reach pt, no answer and mailbox was full.

## 2013-07-03 ENCOUNTER — Encounter: Payer: Self-pay | Admitting: Family Medicine

## 2013-07-03 ENCOUNTER — Ambulatory Visit (INDEPENDENT_AMBULATORY_CARE_PROVIDER_SITE_OTHER): Payer: BC Managed Care – PPO | Admitting: Family Medicine

## 2013-07-03 VITALS — BP 118/62 | HR 115 | Temp 98.6°F | Ht 73.5 in | Wt 180.0 lb

## 2013-07-03 DIAGNOSIS — Z0000 Encounter for general adult medical examination without abnormal findings: Secondary | ICD-10-CM

## 2013-07-03 LAB — BASIC METABOLIC PANEL
GFR: 92.14 mL/min (ref >60.00)
Glucose, Bld: 93 mg/dL (ref 70–99)
Potassium: 4.3 mEq/L (ref 3.5–5.1)
Sodium: 139 mEq/L (ref 135–145)

## 2013-07-03 LAB — POCT URINALYSIS DIPSTICK
Bilirubin, UA: NEGATIVE
Blood, UA: NEGATIVE
Glucose, UA: NEGATIVE
Ketones, UA: NEGATIVE
Spec Grav, UA: 1.015
pH, UA: 7

## 2013-07-03 LAB — LIPID PANEL
Total CHOL/HDL Ratio: 3
VLDL: 20 mg/dL (ref 0.0–40.0)

## 2013-07-03 LAB — CBC WITH DIFFERENTIAL/PLATELET
Basophils Relative: 0.4 % (ref 0.0–3.0)
Eosinophils Absolute: 0.1 10*3/uL (ref 0.0–0.7)
Eosinophils Relative: 2 % (ref 0.0–5.0)
Hemoglobin: 16.6 g/dL (ref 13.0–17.0)
MCHC: 34.7 g/dL (ref 30.0–36.0)
MCV: 93.4 fl (ref 78.0–100.0)
Monocytes Absolute: 0.7 10*3/uL (ref 0.1–1.0)
Neutro Abs: 3.2 10*3/uL (ref 1.4–7.7)
RBC: 5.13 Mil/uL (ref 4.22–5.81)
WBC: 5.9 10*3/uL (ref 4.5–10.5)

## 2013-07-03 LAB — HEPATIC FUNCTION PANEL
AST: 19 U/L (ref 0–37)
Albumin: 5 g/dL (ref 3.5–5.2)
Alkaline Phosphatase: 65 U/L (ref 39–117)
Bilirubin, Direct: 0 mg/dL (ref 0.0–0.3)
Total Bilirubin: 1.4 mg/dL — ABNORMAL HIGH (ref 0.3–1.2)

## 2013-07-03 MED ORDER — AMPHETAMINE-DEXTROAMPHETAMINE 30 MG PO TABS: 30.0000 mg | ORAL_TABLET | Freq: Every day | ORAL | Status: DC

## 2013-07-03 MED ORDER — CLONAZEPAM 1 MG PO TABS: 1.0000 mg | ORAL_TABLET | Freq: Two times a day (BID) | ORAL | Status: DC | PRN

## 2013-07-03 MED ORDER — TEMAZEPAM 30 MG PO CAPS: 30.0000 mg | ORAL_CAPSULE | Freq: Every evening | ORAL | Status: DC | PRN

## 2013-07-03 NOTE — Progress Notes (Signed)
  Subjective:    Patient ID: Mitchell Stone, male    DOB: 12-02-1990, 22 y.o.   MRN: [id number]  HPI 22 yr old male for a cpx. He has a few items to discuss. He has been taking Vyvanse for ADHD and it helps, but he thinks it keeps him up at night. He says he often tosses and turns all night and cannot fall asleep. He wants to go back on Adderall since it did not have side effects.    Review of Systems  Constitutional: Negative.   HENT: Negative.   Eyes: Negative.   Respiratory: Negative.   Cardiovascular: Negative.   Gastrointestinal: Negative.   Genitourinary: Negative.   Musculoskeletal: Negative.   Skin: Negative.   Neurological: Negative.   Psychiatric/Behavioral: Negative.        Objective:   Physical Exam  Constitutional: He is oriented to person, place, and time. He appears well-developed and well-nourished. No distress.  HENT:  Head: Normocephalic and atraumatic.  Right Ear: External ear normal.  Left Ear: External ear normal.  Nose: Nose normal.  Mouth/Throat: Oropharynx is clear and moist. No oropharyngeal exudate.  Eyes: Conjunctivae and EOM are normal. Pupils are equal, round, and reactive to light. Right eye exhibits no discharge. Left eye exhibits no discharge. No scleral icterus.  Neck: Neck supple. No JVD present. No tracheal deviation present. No thyromegaly present.  Cardiovascular: Normal rate, regular rhythm, normal heart sounds and intact distal pulses.  Exam reveals no gallop and no friction rub.   No murmur heard. Pulmonary/Chest: Effort normal and breath sounds normal. No respiratory distress. He has no wheezes. He has no rales. He exhibits no tenderness.  Abdominal: Soft. Bowel sounds are normal. He exhibits no distension and no mass. There is no tenderness. There is no rebound and no guarding.  Genitourinary: Rectum normal, prostate normal and penis normal. Guaiac negative stool. No penile tenderness.  Musculoskeletal: Normal range of motion. He  exhibits no edema and no tenderness.  Lymphadenopathy:    He has no cervical adenopathy.  Neurological: He is alert and oriented to person, place, and time. He has normal reflexes. No cranial nerve deficit. He exhibits normal muscle tone. Coordination normal.  Skin: Skin is warm and dry. No rash noted. He is not diaphoretic. No erythema. No pallor.  Psychiatric: He has a normal mood and affect. His behavior is normal. Judgment and thought content normal.          Assessment & Plan:  Well exam. Get fasting labs. Switch back to Adderall 30 mg daily. Try Temazepam for sleep.

## 2013-07-06 NOTE — Progress Notes (Signed)
Quick Note:  I tried to reach pt by phone, mail box was full. I put a copy of results in mail. ______

## 2013-07-09 ENCOUNTER — Telehealth: Payer: Self-pay | Admitting: Family Medicine

## 2013-07-09 NOTE — Telephone Encounter (Signed)
Mitchell Stone the father would like some information regarding his sons health, father would like to know where do go from here as far as his last visit he had with Dr. Clent Ridges.

## 2013-07-11 NOTE — Telephone Encounter (Signed)
He seems to be in good general health. We are just trying to find the best ADHD med for him

## 2013-07-11 NOTE — Telephone Encounter (Signed)
I spoke with dad, he is on HIPPA form. He said that pt is not sleeping and also drinking at night. He wants to know where to go from here? I asked if he wanted a referral for Behavioral Health and he declined, the only other advise was for pt to come back in with dad and discuss things further with the Dr. Clent Ridges.

## 2013-07-11 NOTE — Telephone Encounter (Signed)
Father would like you to call hiom concerning pt. Father states he did turn in [place]. pls call.

## 2014-01-30 ENCOUNTER — Encounter: Payer: Self-pay | Admitting: *Deleted

## 2014-01-30 ENCOUNTER — Ambulatory Visit (INDEPENDENT_AMBULATORY_CARE_PROVIDER_SITE_OTHER): Payer: BC Managed Care – PPO | Admitting: Family Medicine

## 2014-01-30 ENCOUNTER — Encounter: Payer: Self-pay | Admitting: Family Medicine

## 2014-01-30 ENCOUNTER — Telehealth: Payer: Self-pay | Admitting: Family Medicine

## 2014-01-30 VITALS — BP 110/80 | HR 50 | Temp 98.5°F | Ht 73.5 in | Wt 182.0 lb

## 2014-01-30 DIAGNOSIS — IMO0002 Reserved for concepts with insufficient information to code with codable children: Secondary | ICD-10-CM

## 2014-01-30 DIAGNOSIS — F429 Obsessive-compulsive disorder, unspecified: Secondary | ICD-10-CM

## 2014-01-30 DIAGNOSIS — R5383 Other fatigue: Principal | ICD-10-CM

## 2014-01-30 DIAGNOSIS — Z202 Contact with and (suspected) exposure to infections with a predominantly sexual mode of transmission: Secondary | ICD-10-CM

## 2014-01-30 DIAGNOSIS — F909 Attention-deficit hyperactivity disorder, unspecified type: Secondary | ICD-10-CM

## 2014-01-30 DIAGNOSIS — S3022XA Contusion of scrotum and testes, initial encounter: Secondary | ICD-10-CM

## 2014-01-30 DIAGNOSIS — R5381 Other malaise: Secondary | ICD-10-CM

## 2014-01-30 LAB — CBC WITH DIFFERENTIAL/PLATELET
BASOS ABS: 0 10*3/uL (ref 0.0–0.1)
Basophils Relative: 0.6 % (ref 0.0–3.0)
EOS ABS: 0.2 10*3/uL (ref 0.0–0.7)
Eosinophils Relative: 3.4 % (ref 0.0–5.0)
HCT: 46.3 % (ref 39.0–52.0)
Hemoglobin: 16 g/dL (ref 13.0–17.0)
LYMPHS PCT: 30.8 % (ref 12.0–46.0)
Lymphs Abs: 1.6 10*3/uL (ref 0.7–4.0)
MCHC: 34.6 g/dL (ref 30.0–36.0)
MCV: 96.1 fl (ref 78.0–100.0)
MONO ABS: 0.6 10*3/uL (ref 0.1–1.0)
Monocytes Relative: 10.6 % (ref 3.0–12.0)
NEUTROS PCT: 54.6 % (ref 43.0–77.0)
Neutro Abs: 2.8 10*3/uL (ref 1.4–7.7)
PLATELETS: 309 10*3/uL (ref 150.0–400.0)
RBC: 4.81 Mil/uL (ref 4.22–5.81)
RDW: 13.1 % (ref 11.5–14.6)
WBC: 5.2 10*3/uL (ref 4.5–10.5)

## 2014-01-30 LAB — BASIC METABOLIC PANEL
BUN: 19 mg/dL (ref 6–23)
CALCIUM: 9.6 mg/dL (ref 8.4–10.5)
CO2: 28 mEq/L (ref 19–32)
Chloride: 101 mEq/L (ref 96–112)
Creatinine, Ser: 1.1 mg/dL (ref 0.4–1.5)
GFR: 90.7 mL/min (ref >60.00)
Glucose, Bld: 93 mg/dL (ref 70–99)
Potassium: 4.1 mEq/L (ref 3.5–5.1)
Sodium: 136 mEq/L (ref 135–145)

## 2014-01-30 LAB — HEPATIC FUNCTION PANEL
ALBUMIN: 4.3 g/dL (ref 3.5–5.2)
ALT: 23 U/L (ref 0–53)
AST: 27 U/L (ref 0–37)
Alkaline Phosphatase: 74 U/L (ref 39–117)
Bilirubin, Direct: 0.2 mg/dL (ref 0.0–0.3)
Total Bilirubin: 1.5 mg/dL — ABNORMAL HIGH (ref 0.3–1.2)
Total Protein: 7.2 g/dL (ref 6.0–8.3)

## 2014-01-30 LAB — TSH: TSH: 2.26 u[IU]/mL (ref 0.35–5.50)

## 2014-01-30 MED ORDER — CLONAZEPAM 1 MG PO TABS: 1.0000 mg | ORAL_TABLET | Freq: Two times a day (BID) | ORAL | Status: DC | PRN

## 2014-01-30 MED ORDER — AMPHETAMINE-DEXTROAMPHET ER 30 MG PO CP24: 30.0000 mg | ORAL_CAPSULE | ORAL | Status: DC

## 2014-01-30 NOTE — Progress Notes (Signed)
   Subjective:    Patient ID: Mitchell Stone, male    DOB: Apr 10, 1991, 23 y.o.   MRN: [id number]  HPI Here for several issues. First he would like us to resume treating his ADHD and anxiety. He had [facility] Trina AoFiegel for awhile, and he had been prescribing Vyvanse. However Mitchell SoursGreg feels the Adderall worked better and he does not have time to drive to [place]. He is currently sleeping well having stopped using Temazepam. He has been very fatigued lately and asks to have some labs done. He also asks me to check his testicles. Lats weekend while throwing a football around he was struck in the groin area and had a lot of pain. The testicles swelled a bit but they are slowly getting back to normal. No hematuria.    Review of Systems  Constitutional: Positive for fatigue.  Respiratory: Negative.   Cardiovascular: Negative.   Genitourinary: Positive for scrotal swelling and testicular pain. Negative for hematuria.  Neurological: Negative.   Psychiatric/Behavioral: Positive for decreased concentration. Negative for confusion and dysphoric mood. The patient is nervous/anxious.        Objective:   Physical Exam  Constitutional: He appears well-developed and well-nourished.  Neck: No thyromegaly present.  Cardiovascular: Normal rate, regular rhythm, normal heart sounds and intact distal pulses.   Pulmonary/Chest: Effort normal and breath sounds normal.  Genitourinary:  The testicles are mildly tender but not swollen           Assessment & Plan:  Get back on Adderall XR. Get labs today. The testicles should get back to normal within the week

## 2014-01-30 NOTE — Progress Notes (Signed)
Pre visit review using our clinic review tool, if applicable. No additional management support is needed unless otherwise documented below in the visit note. 

## 2014-01-30 NOTE — Telephone Encounter (Signed)
Relevant patient education mailed to patient.  

## 2014-01-31 LAB — HIV ANTIBODY (ROUTINE TESTING W REFLEX): HIV: NONREACTIVE

## 2014-01-31 LAB — RPR

## 2014-02-28 ENCOUNTER — Encounter: Payer: Self-pay | Admitting: Family Medicine

## 2014-05-07 ENCOUNTER — Telehealth: Payer: Self-pay | Admitting: Family Medicine

## 2014-05-07 NOTE — Telephone Encounter (Signed)
Call in #90 with 5 rf 

## 2014-05-07 NOTE — Telephone Encounter (Signed)
[facility] requested a refill on clonazePAM (KLONOPIN) 1 MG tablet 90 tab, I tablet every morning and 2 tablets in the evening

## 2014-05-09 MED ORDER — CLONAZEPAM 1 MG PO TABS: 1.0000 mg | ORAL_TABLET | Freq: Three times a day (TID) | ORAL | Status: DC | PRN

## 2014-05-09 NOTE — Telephone Encounter (Signed)
[facility] is requesting re-fill on the [facility].

## 2014-05-09 NOTE — Telephone Encounter (Signed)
I called in script 

## 2014-06-24 ENCOUNTER — Encounter: Payer: Self-pay | Admitting: Family Medicine

## 2014-06-24 ENCOUNTER — Ambulatory Visit (INDEPENDENT_AMBULATORY_CARE_PROVIDER_SITE_OTHER): Payer: BC Managed Care – PPO | Admitting: Family Medicine

## 2014-06-24 VITALS — BP 115/69 | HR 70 | Temp 98.0°F | Ht 73.5 in | Wt 186.0 lb

## 2014-06-24 DIAGNOSIS — M940 Chondrocostal junction syndrome [Tietze]: Secondary | ICD-10-CM

## 2014-06-24 DIAGNOSIS — R079 Chest pain, unspecified: Secondary | ICD-10-CM

## 2014-06-24 DIAGNOSIS — F909 Attention-deficit hyperactivity disorder, unspecified type: Secondary | ICD-10-CM

## 2014-06-24 DIAGNOSIS — F900 Attention-deficit hyperactivity disorder, predominantly inattentive type: Secondary | ICD-10-CM

## 2014-06-24 DIAGNOSIS — R5381 Other malaise: Secondary | ICD-10-CM

## 2014-06-24 DIAGNOSIS — R5383 Other fatigue: Secondary | ICD-10-CM

## 2014-06-24 DIAGNOSIS — F429 Obsessive-compulsive disorder, unspecified: Secondary | ICD-10-CM

## 2014-06-24 MED ORDER — LISDEXAMFETAMINE DIMESYLATE 50 MG PO CAPS: 50.0000 mg | ORAL_CAPSULE | Freq: Every day | ORAL | Status: DC

## 2014-06-24 MED ORDER — DICLOFENAC SODIUM 75 MG PO TBEC: 75.0000 mg | DELAYED_RELEASE_TABLET | Freq: Two times a day (BID) | ORAL | Status: DC | PRN

## 2014-06-24 NOTE — Progress Notes (Signed)
Pre visit review using our clinic review tool, if applicable. No additional management support is needed unless otherwise documented below in the visit note. 

## 2014-06-24 NOTE — Progress Notes (Signed)
   Subjective:    Patient ID: Mitchell Stone, male    DOB: [birth year], 23 y.o.   MRN: [id number]  HPI Here to discuss some chest pains he has had intermittently for the past 2 months. They are sharp and can be mild or severe. They come on suddenly and last only a few seconds. No SOB or sweats or nausea. No GERD sx. He works out with cardio and weights with no difficulty. Also he asks if we can resume writing his Vyvanse. He has been seeing Dr. Trina Ao at [facility] for psychiatric care and he is doing well, so they both suggested we take over writing for this.    Review of Systems  Constitutional: Negative.   Respiratory: Negative.   Cardiovascular: Positive for chest pain. Negative for palpitations and leg swelling.  Neurological: Negative.   Psychiatric/Behavioral: Negative.        Objective:   Physical Exam  Constitutional: He is oriented to person, place, and time. He appears well-developed and well-nourished.  Cardiovascular: Normal rate, regular rhythm, normal heart sounds and intact distal pulses.   EKG normal   Pulmonary/Chest: Effort normal and breath sounds normal. No respiratory distress. He has no wheezes. He has no rales.  He is quite tender over the left sternal margin   Neurological: He is alert and oriented to person, place, and time.  Psychiatric: He has a normal mood and affect. His behavior is normal. Thought content normal.          Assessment & Plan:  We will use Diclofenac bid for the costochondritis and I suggested he avoid lifting weights for a month or two until this goes away. Wrote for Vyvanse for 3 months.

## 2014-10-23 ENCOUNTER — Other Ambulatory Visit: Payer: Self-pay | Admitting: Family Medicine

## 2014-10-24 NOTE — Telephone Encounter (Signed)
Call in #90 with 5 rf 

## 2014-11-28 ENCOUNTER — Telehealth: Payer: Self-pay | Admitting: Family Medicine

## 2014-11-28 ENCOUNTER — Other Ambulatory Visit: Payer: Self-pay | Admitting: Family Medicine

## 2014-11-28 NOTE — Telephone Encounter (Signed)
° ° ° ° °  Pt request refill of the following: ° ° °lisdexamfetamine (VYVANSE) 50 MG capsule ° °Phamacy: °

## 2014-11-29 MED ORDER — LISDEXAMFETAMINE DIMESYLATE 50 MG PO CAPS: 50.0000 mg | ORAL_CAPSULE | Freq: Every day | ORAL | Status: DC

## 2014-11-29 NOTE — Telephone Encounter (Signed)
done

## 2014-11-29 NOTE — Telephone Encounter (Signed)
Script is ready for pick up, tried to reach pt and no answer.  

## 2015-01-28 ENCOUNTER — Ambulatory Visit (INDEPENDENT_AMBULATORY_CARE_PROVIDER_SITE_OTHER): Payer: BLUE CROSS/BLUE SHIELD | Admitting: Family Medicine

## 2015-01-28 ENCOUNTER — Encounter: Payer: Self-pay | Admitting: Family Medicine

## 2015-01-28 VITALS — BP 100/55 | HR 73 | Temp 99.3°F | Ht 73.5 in | Wt 196.0 lb

## 2015-01-28 DIAGNOSIS — R5383 Other fatigue: Secondary | ICD-10-CM

## 2015-01-28 LAB — HEPATIC FUNCTION PANEL
ALT: 19 U/L (ref 0–53)
AST: 26 U/L (ref 0–37)
Albumin: 4.7 g/dL (ref 3.5–5.2)
Alkaline Phosphatase: 85 U/L (ref 39–117)
BILIRUBIN DIRECT: 0.1 mg/dL (ref 0.0–0.3)
BILIRUBIN TOTAL: 0.6 mg/dL (ref 0.2–1.2)
Total Protein: 7.3 g/dL (ref 6.0–8.3)

## 2015-01-28 LAB — CBC WITH DIFFERENTIAL/PLATELET
BASOS PCT: 0.5 % (ref 0.0–3.0)
Basophils Absolute: 0 10*3/uL (ref 0.0–0.1)
Eosinophils Absolute: 0.2 10*3/uL (ref 0.0–0.7)
Eosinophils Relative: 4 % (ref 0.0–5.0)
HCT: 44.3 % (ref 39.0–52.0)
Hemoglobin: 15.3 g/dL (ref 13.0–17.0)
LYMPHS ABS: 1.5 10*3/uL (ref 0.7–4.0)
Lymphocytes Relative: 28.8 % (ref 12.0–46.0)
MCHC: 34.5 g/dL (ref 30.0–36.0)
MCV: 92.2 fl (ref 78.0–100.0)
MONO ABS: 0.5 10*3/uL (ref 0.1–1.0)
Monocytes Relative: 9.5 % (ref 3.0–12.0)
Neutro Abs: 3 10*3/uL (ref 1.4–7.7)
Neutrophils Relative %: 57.2 % (ref 43.0–77.0)
Platelets: 304 10*3/uL (ref 150.0–400.0)
RBC: 4.81 Mil/uL (ref 4.22–5.81)
RDW: 12.4 % (ref 11.5–15.5)
WBC: 5.2 10*3/uL (ref 4.0–10.5)

## 2015-01-28 LAB — BASIC METABOLIC PANEL
BUN: 13 mg/dL (ref 6–23)
CALCIUM: 9.6 mg/dL (ref 8.4–10.5)
CHLORIDE: 103 meq/L (ref 96–112)
CO2: 28 meq/L (ref 19–32)
Creatinine, Ser: 1.03 mg/dL (ref 0.40–1.50)
GFR: 93.01 mL/min (ref >60.00)
Glucose, Bld: 97 mg/dL (ref 70–99)
Potassium: 3.9 mEq/L (ref 3.5–5.1)
SODIUM: 137 meq/L (ref 135–145)

## 2015-01-28 LAB — TSH: TSH: 2.12 u[IU]/mL (ref 0.35–4.50)

## 2015-01-28 LAB — POCT URINALYSIS DIPSTICK
Bilirubin, UA: NEGATIVE
Glucose, UA: NEGATIVE
Ketones, UA: NEGATIVE
PROTEIN UA: NEGATIVE
Spec Grav, UA: <=1.005
UROBILINOGEN UA: NEGATIVE
pH, UA: 6

## 2015-01-28 LAB — VITAMIN B12: Vitamin B-12: 260 pg/mL (ref 211–911)

## 2015-01-28 NOTE — Progress Notes (Signed)
Pre visit review using our clinic review tool, if applicable. No additional management support is needed unless otherwise documented below in the visit note. 

## 2015-01-28 NOTE — Progress Notes (Signed)
   Subjective:    Patient ID: Mitchell Stone, male    DOB: [birth year], 24 y.o.   MRN: [id number]  HPI Here for one week of profound fatigue along with some ST. No fevers or HA. No cough, no NVD.    Review of Systems  Constitutional: Positive for fatigue. Negative for fever, chills and diaphoresis.  HENT: Negative.   Eyes: Negative.   Respiratory: Negative.   Cardiovascular: Negative.   Gastrointestinal: Negative.   Genitourinary: Negative.        Objective:   Physical Exam  Constitutional: He appears well-developed and well-nourished.  He is a bit lethargic, somewhat ill appearing  HENT:  Right Ear: External ear normal.  Left Ear: External ear normal.  Nose: Nose normal.  Mouth/Throat: Oropharynx is clear and moist.  Eyes: Conjunctivae are normal.  Neck: No thyromegaly present.  Cardiovascular: Normal rate, regular rhythm, normal heart sounds and intact distal pulses.   Pulmonary/Chest: Effort normal and breath sounds normal.  Abdominal: Soft. Bowel sounds are normal. He exhibits no distension and no mass. There is no tenderness. There is no rebound and no guarding.  Lymphadenopathy:    He has no cervical adenopathy.          Assessment & Plan:  It is likely he has mononucleosis. We will get labs today. Rest and drink fluids.

## 2015-01-29 LAB — EPSTEIN-BARR VIRUS VCA, IGG: EBV VCA IgG: 199 U/mL — ABNORMAL HIGH (ref <18.0)

## 2015-01-29 LAB — EPSTEIN-BARR VIRUS VCA, IGM: EBV VCA IgM: <10 U/mL (ref <36.0)

## 2015-02-04 ENCOUNTER — Ambulatory Visit (INDEPENDENT_AMBULATORY_CARE_PROVIDER_SITE_OTHER): Payer: BLUE CROSS/BLUE SHIELD | Admitting: Family Medicine

## 2015-02-04 ENCOUNTER — Encounter: Payer: Self-pay | Admitting: Family Medicine

## 2015-02-04 VITALS — BP 107/58 | HR 62 | Temp 99.1°F | Ht 73.0 in | Wt 197.0 lb

## 2015-02-04 DIAGNOSIS — B2790 Infectious mononucleosis, unspecified without complication: Secondary | ICD-10-CM

## 2015-02-04 DIAGNOSIS — F900 Attention-deficit hyperactivity disorder, predominantly inattentive type: Secondary | ICD-10-CM

## 2015-02-04 MED ORDER — AMPHETAMINE-DEXTROAMPHETAMINE 30 MG PO TABS: 30.0000 mg | ORAL_TABLET | Freq: Every day | ORAL | Status: DC

## 2015-02-04 NOTE — Progress Notes (Signed)
Pre visit review using our clinic review tool, if applicable. No additional management support is needed unless otherwise documented below in the visit note. 

## 2015-02-04 NOTE — Progress Notes (Signed)
   Subjective:    Patient ID: Mitchell Stone, male    DOB: [birth year], 24 y.o.   MRN: [id number]  HPI Here to follow up on mononucleosis. He feels a tiny bit better than he did last week but he is still profoundly fatigued. He gets a lot of sleep. He did work a few hours this morning. He asks to go back on Adderall because Vyvanse made it hard for him to sleep at night.    Review of Systems  Constitutional: Positive for fatigue. Negative for fever.  Respiratory: Negative.   Cardiovascular: Negative.   Neurological: Negative.        Objective:   Physical Exam  Constitutional: He is oriented to person, place, and time. He appears well-developed and well-nourished.  He has better color than at the last visit   Cardiovascular: Normal rate, regular rhythm, normal heart sounds and intact distal pulses.   Pulmonary/Chest: Effort normal and breath sounds normal.  Neurological: He is alert and oriented to person, place, and time.          Assessment & Plan:  He is recovering from mono as expected. I told him the fatigue will slowly resolve but it may take several months to feel back to normal. Get back on Adderall 30 mg daily.

## 2015-02-14 ENCOUNTER — Telehealth: Payer: Self-pay | Admitting: Family Medicine

## 2015-02-14 ENCOUNTER — Ambulatory Visit: Payer: BLUE CROSS/BLUE SHIELD | Admitting: Family Medicine

## 2015-02-14 DIAGNOSIS — Z0289 Encounter for other administrative examinations: Secondary | ICD-10-CM

## 2015-02-14 NOTE — Telephone Encounter (Signed)
Pt was on schedule to see Dr. Clent RidgesFry this morning. I tried to call pt and no answer. Pt should reschedule as needed.

## 2015-02-18 ENCOUNTER — Telehealth: Payer: Self-pay | Admitting: Family Medicine

## 2015-02-18 NOTE — Telephone Encounter (Signed)
Please send to Sylvia Nimmons, RN when complete 

## 2015-02-18 NOTE — Telephone Encounter (Signed)
Pt request refill of the following: clonazePAM (KLONOPIN) 1 MG tablet  Dad said sometimes his son has to take more that 3. Sometime he takes 4   Dad would like a call back      [facility]

## 2015-02-19 NOTE — Telephone Encounter (Signed)
Call in Xanax 0.5 mg to take TID prn anxiety, #90 with no rf

## 2015-02-19 NOTE — Telephone Encounter (Signed)
I spoke with pt and he would like to try the Xanax.

## 2015-02-19 NOTE — Telephone Encounter (Signed)
I suggest he switch to something that will be a little stronger and last longer, like Xanax. If he agrees we will call some in

## 2015-02-20 MED ORDER — ALPRAZOLAM 0.5 MG PO TABS
0.5000 mg | ORAL_TABLET | Freq: Three times a day (TID) | ORAL | Status: DC | PRN
Start: 2015-02-20 — End: 2015-03-05

## 2015-02-20 NOTE — Addendum Note (Signed)
Addended by: Aniceto BossNIMMONS, SYLVIA A on: 02/20/2015 10:37 AM   Modules accepted: Orders, Medications

## 2015-02-20 NOTE — Telephone Encounter (Signed)
I called in script and spoke with pt. 

## 2015-03-04 ENCOUNTER — Telehealth: Payer: Self-pay | Admitting: Family Medicine

## 2015-03-04 NOTE — Telephone Encounter (Signed)
We will see him tomorrow, but in the meantime do NOT take any more Xanax

## 2015-03-04 NOTE — Telephone Encounter (Signed)
Pt is on schedule to see Dr. Clent RidgesFry on 03/05/15.

## 2015-03-04 NOTE — Telephone Encounter (Signed)
[facility] - Client TELEPHONE ADVICE RECORD [facility]  Patient Name: Mitchell OdorGREGORY Hinton  Gender: Male  DOB: [birth year]   Age: [age] Y 9 M 14 D  Return Phone Number: [phone] (Primary)  Address:   City[place]State[place]Zip: [place] [id number]   Client [facility] - Client  Client Site [facility] - Day  Physician Gershon CraneFry, Stephen   Contact Type Call  Call Type Triage / Clinical  Relationship To Patient Self  Appointment Disposition EMR Appointment Scheduled  Info pasted into Epic Yes  Return Phone Number [phone] (Primary)  Chief Complaint Medication reaction  Initial Comment Caller states, was having complications w/ anxiety Rx. He still has panic attacks. After switching to another Rx, he is now having other side effects. He feels sedated, and very depressed   PreDisposition Call Doctor   Nurse Assessment  Nurse: Arnold LongMaples, RN, Trish Date/Time Lamount Cohen(Eastern Time): 03/04/2015 10:52:11 AM  Confirm and document reason for call. If symptomatic, describe symptoms. ---Patient is calling for self and caller states, was having complications w/ anxiety Rx. He still has panic attacks. After switching to another Rx, he is now having other side effects. He feels sedated, and very depressed. He was seen by Dr. Clent RidgesFry and was given xanax and he has been "out of it". He feels spacey and depressed. He is prescribed to take 3 times a day.  Has the patient traveled out of the country within the last 30 days? ---No  Does the patient require triage? ---Yes  Related visit to physician within the last 2 weeks? ---Yes  Does the PT have any chronic conditions? (i.e. diabetes, asthma, etc.) ---Yes  List chronic conditions. ---He is prazosin for BP aderall 30mg  PTSD depression anxiety     Guidelines      Guideline Title Affirmed Question Affirmed Notes Nurse Date/Time (Eastern Time)  Depression [1] Depression AND [2] worsening  (e.g.,sleeping poorly, less able to do activities of daily living)  [place]ples, [place], Trish 03/04/2015 11:00:46 AM   Disp. Time Lamount Cohen(Eastern Time) Disposition Final User          03/04/2015 11:04:39 AM See Physician within 24 Hours Yes Arnold LongMaples, RN, Scientist, product/process developmentTrish        Caller Understands: Yes  Disagree/Comply: Comply     Care Advice Given Per Guideline      SEE PHYSICIAN WITHIN 24 HOURS: ALTERNATE DISPOSITION - CALL MENTAL HEALTH PROFESSIONAL WITHIN 24 HOURS: If patient has a Garment/textile technologistprivate psychiatrist, psychologist or counselor, recommend the caller speak with this mental health professional in the next 24 hours. REASSURANCE: * People with depression do get through this -- even people who feel as badly as you feel now. You can be helped. * Encourage the caller to talk about his/her problems and feelings. * Offer hope. * You feel like harming yourself * You become worse. CALL BACK IF: CARE ADVICE given per Depression (Adult) guideline.   After Care Instructions Given     Call Event Type User Date / Time Description        Referrals  REFERRED TO PCP OFFICE

## 2015-03-04 NOTE — Telephone Encounter (Signed)
I spoke with pt and went over below information. 

## 2015-03-05 ENCOUNTER — Encounter: Payer: Self-pay | Admitting: Family Medicine

## 2015-03-05 ENCOUNTER — Ambulatory Visit (INDEPENDENT_AMBULATORY_CARE_PROVIDER_SITE_OTHER): Payer: BLUE CROSS/BLUE SHIELD | Admitting: Family Medicine

## 2015-03-05 VITALS — BP 100/53 | HR 58 | Temp 99.8°F | Ht 73.0 in | Wt 199.0 lb

## 2015-03-05 DIAGNOSIS — F900 Attention-deficit hyperactivity disorder, predominantly inattentive type: Secondary | ICD-10-CM

## 2015-03-05 DIAGNOSIS — F411 Generalized anxiety disorder: Secondary | ICD-10-CM

## 2015-03-05 DIAGNOSIS — B2790 Infectious mononucleosis, unspecified without complication: Secondary | ICD-10-CM

## 2015-03-05 MED ORDER — LISDEXAMFETAMINE DIMESYLATE 60 MG PO CAPS: 60.0000 mg | ORAL_CAPSULE | ORAL | Status: DC

## 2015-03-05 MED ORDER — PRAZOSIN HCL 1 MG PO CAPS: 3.0000 mg | ORAL_CAPSULE | Freq: Every day | ORAL | Status: DC

## 2015-03-05 MED ORDER — CLONAZEPAM 1 MG PO TABS: 1.0000 mg | ORAL_TABLET | Freq: Three times a day (TID) | ORAL | Status: DC | PRN

## 2015-03-05 NOTE — Progress Notes (Signed)
Pre visit review using our clinic review tool, if applicable. No additional management support is needed unless otherwise documented below in the visit note. 

## 2015-03-06 ENCOUNTER — Encounter: Payer: Self-pay | Admitting: Family Medicine

## 2015-03-06 DIAGNOSIS — F411 Generalized anxiety disorder: Secondary | ICD-10-CM | POA: Insufficient documentation

## 2015-03-06 NOTE — Progress Notes (Signed)
   Subjective:    Patient ID: Mitchell Stone, male    DOB: [birth year], 24 y.o.   MRN: [id number]  HPI Here to follow up on ADHD and anxiety. hehas recovered well from the mononucleosis, and his energy level is almost back to normal. He tried Xanax for one day but found it to be too sedating for him, so he went back on Clonazepam. He is pleased with how this is working. However he is not happy with Adderall. It does not work as well as he would like and its effects wear off after only a few hours. He is interested in trying Vyvanse since his friend is doing well on this.    Review of Systems  Constitutional: Negative.   Respiratory: Negative.   Cardiovascular: Negative.   Neurological: Negative.   Psychiatric/Behavioral: Positive for decreased concentration. Negative for behavioral problems, confusion, dysphoric mood and agitation. The patient is not nervous/anxious.        Objective:   Physical Exam  Constitutional: He is oriented to person, place, and time. He appears well-developed and well-nourished.  Cardiovascular: Normal rate, regular rhythm, normal heart sounds and intact distal pulses.   Pulmonary/Chest: Effort normal and breath sounds normal.  Neurological: He is alert and oriented to person, place, and time.  Psychiatric: He has a normal mood and affect. His behavior is normal. Thought content normal.          Assessment & Plan:  He is over the mono. He will stay on Clonazepam. Try Vyvanse 60 mg daily and recheck in one month

## 2015-03-25 ENCOUNTER — Other Ambulatory Visit: Payer: Self-pay

## 2015-03-25 NOTE — Telephone Encounter (Signed)
May we refill Clonazepam 1 mg tab (take 1 tab 3 times  A day as needed) Las t visit May 2016, [facility]

## 2015-03-26 ENCOUNTER — Other Ambulatory Visit: Payer: Self-pay | Admitting: Family Medicine

## 2015-03-27 ENCOUNTER — Telehealth: Payer: Self-pay

## 2015-03-27 ENCOUNTER — Telehealth: Payer: Self-pay | Admitting: Family Medicine

## 2015-03-27 NOTE — Telephone Encounter (Signed)
Call in #90 with 5 rf 

## 2015-03-27 NOTE — Telephone Encounter (Signed)
duplicate

## 2015-03-27 NOTE — Telephone Encounter (Signed)
[facility] - [place], [place] - [address] clonazePAM (KLONOPIN) 1 MG tablet

## 2015-03-27 NOTE — Telephone Encounter (Addendum)
Pt request refill clonazePAM (KLONOPIN) 1 MG tablet [facility] states they never received the new rx sent in 5/25 Can you resend?  Pt only has 1 tab last and takes 3/ day.  Pt has mood swings w/out and difficulty w/ depression. Pt had no idea rx wasn't at the [facility]

## 2015-03-28 MED ORDER — CLONAZEPAM 1 MG PO TABS
1.0000 mg | ORAL_TABLET | Freq: Three times a day (TID) | ORAL | Status: DC | PRN
Start: 2015-03-28 — End: 2015-09-09

## 2015-03-28 NOTE — Telephone Encounter (Signed)
Per Dr. Clent Ridges call in # [phone] with 5 refills. I called in script and spoke with pt.

## 2015-03-28 NOTE — Telephone Encounter (Signed)
Duplicate, see previous phone note.  

## 2015-04-09 ENCOUNTER — Telehealth: Payer: Self-pay | Admitting: Family Medicine

## 2015-04-09 ENCOUNTER — Ambulatory Visit: Payer: BLUE CROSS/BLUE SHIELD | Admitting: Family Medicine

## 2015-04-09 DIAGNOSIS — Z0289 Encounter for other administrative examinations: Secondary | ICD-10-CM

## 2015-04-09 NOTE — Telephone Encounter (Signed)
Pt was on schedule to see Dr. Clent RidgesFry today. I tried to reach pt by phone and no answer or option to leave a message. Dr. Clent RidgesFry recommends that pt reschedule as needed.

## 2015-04-17 ENCOUNTER — Telehealth: Payer: Self-pay | Admitting: Family Medicine

## 2015-04-17 NOTE — Telephone Encounter (Signed)
Pt request refill of the following: lisdexamfetamine (VYVANSE) 60 MG capsule   Phamacy:

## 2015-04-17 NOTE — Telephone Encounter (Signed)
Pt is aware that Dr. Clent RidgesFry is out of office until Monday 04/21/15.

## 2015-04-21 MED ORDER — LISDEXAMFETAMINE DIMESYLATE 50 MG PO CAPS: 50.0000 mg | ORAL_CAPSULE | Freq: Every day | ORAL | Status: DC

## 2015-04-21 NOTE — Telephone Encounter (Signed)
Noted. We will decrease the Vyvanse dose to 50 mg daily.

## 2015-04-21 NOTE — Telephone Encounter (Signed)
I spoke with pt and he does not think that the Vyvanse is helping with this concentration. He would like to go back on Adderall, however the 30 mg wasn't strong enough. Do you need to see pt for a office visit or can we change this medication?

## 2015-04-21 NOTE — Telephone Encounter (Signed)
Pt is calling to let md know that vyvanse is effecting his eating

## 2015-04-22 MED ORDER — AMPHETAMINE-DEXTROAMPHET ER 20 MG PO CP24: 40.0000 mg | ORAL_CAPSULE | ORAL | Status: DC

## 2015-04-22 NOTE — Telephone Encounter (Signed)
Tell him we will try taking two Adderall XR 20 mg tabs each morning for a total of 40 mg a day. Rx is ready to pick up

## 2015-04-22 NOTE — Addendum Note (Signed)
Addended by: Gershon CraneFRY, STEPHEN A on: 04/22/2015 05:49 PM   Modules accepted: Orders, Medications

## 2015-04-23 ENCOUNTER — Telehealth: Payer: Self-pay | Admitting: Family Medicine

## 2015-04-23 NOTE — Telephone Encounter (Signed)
Pt did not pick up scripts for Vyvanse 50 mg, these were shredded and Dr. Clent RidgesFry is aware. There was a change in medication.

## 2015-04-23 NOTE — Telephone Encounter (Signed)
Script is ready for pick up and I spoke with pt.  

## 2015-05-26 ENCOUNTER — Other Ambulatory Visit: Payer: Self-pay | Admitting: Family Medicine

## 2015-05-26 NOTE — Telephone Encounter (Signed)
Pt states adderall xr 20 mg twice a day is keeping him up at night. Pt would like to try generic adderall 20 mg twice a day.

## 2015-05-28 MED ORDER — AMPHETAMINE-DEXTROAMPHETAMINE 20 MG PO TABS: 20.0000 mg | ORAL_TABLET | Freq: Two times a day (BID) | ORAL | Status: DC

## 2015-05-28 NOTE — Telephone Encounter (Signed)
Agreed. Try immediate release for a month

## 2015-05-28 NOTE — Telephone Encounter (Signed)
Rx is ready for pick up. Called and spoke with pt and pt is aware.

## 2015-06-02 ENCOUNTER — Telehealth: Payer: Self-pay | Admitting: Family Medicine

## 2015-06-02 NOTE — Telephone Encounter (Signed)
Pt wants to make sure his pharm has sent prior auth request for his amphetamine-dextroamphetamine (ADDERALL) 20 MG tablet BID

## 2015-06-04 NOTE — Telephone Encounter (Signed)
PA was approved. 

## 2015-07-08 ENCOUNTER — Other Ambulatory Visit: Payer: Self-pay | Admitting: Family Medicine

## 2015-07-08 MED ORDER — AMPHETAMINE-DEXTROAMPHETAMINE 20 MG PO TABS: 20.0000 mg | ORAL_TABLET | Freq: Two times a day (BID) | ORAL | Status: DC

## 2015-07-08 NOTE — Telephone Encounter (Signed)
done

## 2015-07-08 NOTE — Telephone Encounter (Signed)
Pt needs new rx generic adderall 20 mg twice a day #60

## 2015-07-09 NOTE — Telephone Encounter (Signed)
Called and spoke with pt and pt is aware.  

## 2015-08-14 ENCOUNTER — Telehealth: Payer: Self-pay | Admitting: Family Medicine

## 2015-08-14 NOTE — Telephone Encounter (Signed)
NO he is not due until 09-27-15

## 2015-08-14 NOTE — Telephone Encounter (Signed)
Patient father requesting a refill on clonazepam (Klonopin) 1 mg tablet  [facility]

## 2015-08-14 NOTE — Telephone Encounter (Signed)
Left a message

## 2015-08-18 ENCOUNTER — Telehealth: Payer: Self-pay | Admitting: Family Medicine

## 2015-08-18 NOTE — Telephone Encounter (Signed)
Pt request refill amphetamine-dextroamphetamine (ADDERALL) 20 MG tablet ° °

## 2015-08-19 MED ORDER — AMPHETAMINE-DEXTROAMPHETAMINE 20 MG PO TABS: 20.0000 mg | ORAL_TABLET | Freq: Two times a day (BID) | ORAL | Status: DC

## 2015-08-19 NOTE — Telephone Encounter (Signed)
done

## 2015-08-19 NOTE — Telephone Encounter (Signed)
Script is ready for pick up, tried to reach pt and no answer.  

## 2015-09-08 ENCOUNTER — Telehealth: Payer: Self-pay | Admitting: Family Medicine

## 2015-09-08 NOTE — Telephone Encounter (Addendum)
Patient has a refill waiting up front for his ADDERALL medication, but he would like to know if he can have 6 prescriptions for the next 6 months so he will not have to come to the office every month to pick it up.  Also, patient would like his Clonazepam medication refilled and patient stated that should be for a period.

## 2015-09-08 NOTE — Telephone Encounter (Signed)
Pt's father calling to see if script for amphetamine-dextroamphetamine (ADDERALL) 20 MG tablet is available for pick up before he drive to office.  I informed pt rx was here and ready for pick.  However, Dr. Clent RidgesFry had not addressed previous message.  Pt's father states he will come by office script up today.

## 2015-09-09 MED ORDER — AMPHETAMINE-DEXTROAMPHETAMINE 20 MG PO TABS: 20.0000 mg | ORAL_TABLET | Freq: Two times a day (BID) | ORAL | Status: DC

## 2015-09-09 MED ORDER — CLONAZEPAM 1 MG PO TABS: 1.0000 mg | ORAL_TABLET | Freq: Three times a day (TID) | ORAL | Status: DC | PRN

## 2015-09-09 NOTE — Telephone Encounter (Signed)
I will refill the Adderall but I am only allowed to write for 3 months at a time. I will refill the clonazepam for 6 months.

## 2015-09-09 NOTE — Telephone Encounter (Signed)
Duplicate note

## 2015-09-09 NOTE — Telephone Encounter (Signed)
Scripts are ready for pick up and I left a message.

## 2015-09-09 NOTE — Addendum Note (Signed)
Addended by: Gershon CraneFRY, STEPHEN A on: 09/09/2015 01:02 PM   Modules accepted: Orders

## 2015-09-10 ENCOUNTER — Other Ambulatory Visit: Payer: Self-pay | Admitting: Family Medicine

## 2015-09-25 ENCOUNTER — Encounter: Payer: Self-pay | Admitting: Family Medicine

## 2015-09-25 ENCOUNTER — Ambulatory Visit (INDEPENDENT_AMBULATORY_CARE_PROVIDER_SITE_OTHER): Payer: BLUE CROSS/BLUE SHIELD | Admitting: Family Medicine

## 2015-09-25 VITALS — BP 98/67 | HR 78 | Temp 98.2°F | Ht 73.0 in | Wt 188.0 lb

## 2015-09-25 DIAGNOSIS — F411 Generalized anxiety disorder: Secondary | ICD-10-CM

## 2015-09-25 DIAGNOSIS — F900 Attention-deficit hyperactivity disorder, predominantly inattentive type: Secondary | ICD-10-CM

## 2015-09-25 DIAGNOSIS — R5382 Chronic fatigue, unspecified: Secondary | ICD-10-CM | POA: Diagnosis not present

## 2015-09-25 DIAGNOSIS — G4700 Insomnia, unspecified: Secondary | ICD-10-CM

## 2015-09-25 LAB — BASIC METABOLIC PANEL
BUN: 22 mg/dL (ref 6–23)
CHLORIDE: 103 meq/L (ref 96–112)
CO2: 32 meq/L (ref 19–32)
Calcium: 9.9 mg/dL (ref 8.4–10.5)
Creatinine, Ser: 1.06 mg/dL (ref 0.40–1.50)
GFR: 89.52 mL/min (ref >60.00)
GLUCOSE: 84 mg/dL (ref 70–99)
POTASSIUM: 4.3 meq/L (ref 3.5–5.1)
SODIUM: 140 meq/L (ref 135–145)

## 2015-09-25 LAB — HEPATIC FUNCTION PANEL
ALBUMIN: 4.5 g/dL (ref 3.5–5.2)
ALK PHOS: 69 U/L (ref 39–117)
ALT: 11 U/L (ref 0–53)
AST: 15 U/L (ref 0–37)
Bilirubin, Direct: 0.2 mg/dL (ref 0.0–0.3)
TOTAL PROTEIN: 7.2 g/dL (ref 6.0–8.3)
Total Bilirubin: 0.8 mg/dL (ref 0.2–1.2)

## 2015-09-25 LAB — CBC WITH DIFFERENTIAL/PLATELET
BASOS ABS: 0 10*3/uL (ref 0.0–0.1)
Basophils Relative: 0.4 % (ref 0.0–3.0)
EOS ABS: 0.5 10*3/uL (ref 0.0–0.7)
Eosinophils Relative: 7.2 % — ABNORMAL HIGH (ref 0.0–5.0)
HCT: 46.7 % (ref 39.0–52.0)
Hemoglobin: 15.8 g/dL (ref 13.0–17.0)
LYMPHS ABS: 2.1 10*3/uL (ref 0.7–4.0)
Lymphocytes Relative: 29.6 % (ref 12.0–46.0)
MCHC: 33.8 g/dL (ref 30.0–36.0)
MCV: 94 fl (ref 78.0–100.0)
Monocytes Absolute: 0.5 10*3/uL (ref 0.1–1.0)
Monocytes Relative: 6.9 % (ref 3.0–12.0)
NEUTROS ABS: 4 10*3/uL (ref 1.4–7.7)
NEUTROS PCT: 55.9 % (ref 43.0–77.0)
PLATELETS: 300 10*3/uL (ref 150.0–400.0)
RBC: 4.96 Mil/uL (ref 4.22–5.81)
RDW: 12.3 % (ref 11.5–15.5)
WBC: 7.1 10*3/uL (ref 4.0–10.5)

## 2015-09-25 LAB — TSH: TSH: 1.81 u[IU]/mL (ref 0.35–4.50)

## 2015-09-25 MED ORDER — TEMAZEPAM 30 MG PO CAPS: 30.0000 mg | ORAL_CAPSULE | Freq: Every evening | ORAL | Status: DC | PRN

## 2015-09-25 NOTE — Progress Notes (Signed)
Pre visit review using our clinic review tool, if applicable. No additional management support is needed unless otherwise documented below in the visit note. 

## 2015-09-25 NOTE — Progress Notes (Signed)
   Subjective:    Patient ID: Mitchell Stone, male    DOB: 08-08-1991, 24 y.o.   MRN: [id number]  HPI Here complaining of chronic fatigue which has been bothering him for 6 months or more. He has no energy and he finds it impossible to exercise. It is difficult to perform his job duties and he feels that he is falling behind. He has been taking training classes and he will be starting a new job in [place] after the holidays. He has trouble sleeping and he is not sure if this is from his other medications or not. He takes Adderall off and on. His anxiety comes and goes.    Review of Systems  Constitutional: Positive for fatigue. Negative for activity change, appetite change and unexpected weight change.  Respiratory: Negative.   Cardiovascular: Negative.   Endocrine: Negative.   Neurological: Negative.   Psychiatric/Behavioral: Positive for sleep disturbance and decreased concentration. Negative for hallucinations, confusion, dysphoric mood and agitation. The patient is not nervous/anxious.        Objective:   Physical Exam  Constitutional: He is oriented to person, place, and time. He appears well-developed and well-nourished. No distress.  Neck: No thyromegaly present.  Cardiovascular: Normal rate, regular rhythm, normal heart sounds and intact distal pulses.   Pulmonary/Chest: Effort normal and breath sounds normal.  Abdominal: Soft. Bowel sounds are normal. He exhibits no distension. There is no tenderness. There is no rebound and no guarding.  Lymphadenopathy:    He has no cervical adenopathy.  Neurological: He is alert and oriented to person, place, and time.  Psychiatric: He has a normal mood and affect. His behavior is normal. Thought content normal.          Assessment & Plan:  His ADHD and anxiety seem to be under control, but he has developed a chronic fatigue picture which is hard to explain. Certainly chronic sleep deprivation can contribute to this, but I think side  effects of his medications are also a factor. We will screen for metabolic causes today with some labwork, but I told him he really needs an expert to manage these issues. I strongly urged him to find a psychiatrist in [place] after he moves there to manage these problems. In the meantime he can try a few Temazepam for sleep.

## 2015-09-29 ENCOUNTER — Telehealth: Payer: Self-pay | Admitting: Family Medicine

## 2015-09-29 NOTE — Telephone Encounter (Signed)
Pt said she is still feeling bad and would like a call back about his lab results.

## 2015-10-01 NOTE — Telephone Encounter (Signed)
I spoke with pt and went over lab results.  

## 2015-10-01 NOTE — Telephone Encounter (Signed)
Please see the result note. 

## 2015-11-12 ENCOUNTER — Telehealth: Payer: Self-pay | Admitting: Family Medicine

## 2015-11-12 NOTE — Telephone Encounter (Signed)
Patient would like a refill on his ADDERALL medication.

## 2015-11-13 ENCOUNTER — Telehealth: Payer: Self-pay | Admitting: Family Medicine

## 2015-11-13 NOTE — Telephone Encounter (Signed)
Pt said he did not get dec nor jan adderall rxs.

## 2015-11-13 NOTE — Telephone Encounter (Signed)
I left a voice message for pt, too soon for refill, not due until 12/09/2015.

## 2015-11-14 NOTE — Telephone Encounter (Signed)
Mitchell Stone has the past logs for Dec and Jan.  When she gets back from lunch, I will get with her and check on this.

## 2015-11-14 NOTE — Telephone Encounter (Signed)
I went through entire month of Dec and Jan and no sign that pt's rx was picked up by him or anyone else.

## 2015-11-14 NOTE — Telephone Encounter (Signed)
Can you check the log book to see if anybody picked up these scripts? It showed was printed.

## 2015-11-18 MED ORDER — AMPHETAMINE-DEXTROAMPHETAMINE 20 MG PO TABS: 20.0000 mg | ORAL_TABLET | Freq: Two times a day (BID) | ORAL | Status: DC

## 2015-11-18 NOTE — Telephone Encounter (Signed)
I will re-write this prescription but for only ONE month at a time

## 2015-11-18 NOTE — Telephone Encounter (Signed)
Script is ready for pick up and I spoke with pt.  

## 2016-02-14 DIAGNOSIS — R1310 Dysphagia, unspecified: Secondary | ICD-10-CM | POA: Diagnosis not present

## 2016-02-14 DIAGNOSIS — R0989 Other specified symptoms and signs involving the circulatory and respiratory systems: Secondary | ICD-10-CM | POA: Diagnosis not present

## 2016-03-18 ENCOUNTER — Encounter: Payer: Self-pay | Admitting: Family Medicine

## 2016-03-18 ENCOUNTER — Ambulatory Visit (INDEPENDENT_AMBULATORY_CARE_PROVIDER_SITE_OTHER): Payer: BLUE CROSS/BLUE SHIELD | Admitting: Family Medicine

## 2016-03-18 VITALS — BP 108/72 | HR 123 | Temp 97.9°F | Ht 75.0 in | Wt 200.6 lb

## 2016-03-18 DIAGNOSIS — F411 Generalized anxiety disorder: Secondary | ICD-10-CM

## 2016-03-18 DIAGNOSIS — F32A Depression, unspecified: Secondary | ICD-10-CM

## 2016-03-18 DIAGNOSIS — F329 Major depressive disorder, single episode, unspecified: Secondary | ICD-10-CM

## 2016-03-18 DIAGNOSIS — F900 Attention-deficit hyperactivity disorder, predominantly inattentive type: Secondary | ICD-10-CM | POA: Diagnosis not present

## 2016-03-18 MED ORDER — SERTRALINE HCL 50 MG PO TABS: 100.0000 mg | ORAL_TABLET | Freq: Every day | ORAL | Status: DC

## 2016-03-18 MED ORDER — BUPROPION HCL ER (XL) 150 MG PO TB24: 450.0000 mg | ORAL_TABLET | Freq: Every day | ORAL | Status: DC

## 2016-03-18 NOTE — Progress Notes (Signed)
Pre visit review using our clinic review tool, if applicable. No additional management support is needed unless otherwise documented below in the visit note. 

## 2016-03-18 NOTE — Progress Notes (Signed)
   Subjective:    Patient ID: Mitchell Stone, male    DOB: 11-26-1990, 25 y.o.   MRN: [id number]  HPI Here asking for advice about depression and ADHD. He says he feels sad much of the time, but he says his main issue is chronic fatigue and inability to focus. He finds it difficult to keep up with the demands of his job and he is worried about losing his job. He has tried numerous medications over the past few year but nothing seems to help much. He had been taking Clonzaepam for anxiety but he took himself off this, and he says he wants to avoid all "benzos" from now on. He has actually [facility] time next month, but he asks if he can do anything in the meantime. He has increased his Wellbutrin on is own to a total of 450 mg daily and he is taking Zoloft 50 mg daily. He still uses Adderall at times. He asks if I could prescribe Provigil, since a lto fo his friends but this off the Internet. He has been doing a lot of research on the Internet apparently.    Review of Systems  Constitutional: Positive for fatigue.  Respiratory: Negative.   Cardiovascular: Negative.   Neurological: Negative.   Psychiatric/Behavioral: Positive for sleep disturbance, dysphoric mood and decreased concentration. Negative for suicidal ideas, hallucinations, behavioral problems, confusion, self-injury and agitation. The patient is nervous/anxious.        Objective:   Physical Exam  Constitutional: He is oriented to person, place, and time. He appears well-developed and well-nourished.  Neurological: He is alert and oriented to person, place, and time.  Psychiatric: His behavior is normal. Judgment and thought content normal.  Neatly dressed. Depressed affect           Assessment & Plan:  He is showing signs of depression and anxiety, along with attention deficit issues. I told him I was not comfortable with prescribing him Provigil, but that is certainly something he can  ask the psychiatrist. During the next few weeks I suggested he increase the Zoloft to a total of 100 mg daily. He will see Psychiatry as noted.  Nelwyn SalisburyFRY,Octa Uplinger A, MD

## 2016-03-19 DIAGNOSIS — F4310 Post-traumatic stress disorder, unspecified: Secondary | ICD-10-CM | POA: Diagnosis not present

## 2016-03-19 DIAGNOSIS — F411 Generalized anxiety disorder: Secondary | ICD-10-CM | POA: Diagnosis not present

## 2016-05-03 NOTE — Progress Notes (Signed)
error 

## 2016-05-05 DIAGNOSIS — F419 Anxiety disorder, unspecified: Secondary | ICD-10-CM | POA: Diagnosis not present

## 2016-06-03 DIAGNOSIS — F332 Major depressive disorder, recurrent severe without psychotic features: Secondary | ICD-10-CM | POA: Diagnosis not present

## 2016-06-21 DIAGNOSIS — F900 Attention-deficit hyperactivity disorder, predominantly inattentive type: Secondary | ICD-10-CM | POA: Diagnosis not present

## 2016-07-28 DIAGNOSIS — F331 Major depressive disorder, recurrent, moderate: Secondary | ICD-10-CM | POA: Diagnosis not present

## 2016-08-18 DIAGNOSIS — Z1212 Encounter for screening for malignant neoplasm of rectum: Secondary | ICD-10-CM | POA: Diagnosis not present

## 2016-08-18 DIAGNOSIS — Z1211 Encounter for screening for malignant neoplasm of colon: Secondary | ICD-10-CM | POA: Diagnosis not present

## 2016-09-13 DIAGNOSIS — Z0000 Encounter for general adult medical examination without abnormal findings: Secondary | ICD-10-CM | POA: Diagnosis not present

## 2016-09-13 DIAGNOSIS — F17200 Nicotine dependence, unspecified, uncomplicated: Secondary | ICD-10-CM | POA: Diagnosis not present

## 2016-09-16 DIAGNOSIS — Z125 Encounter for screening for malignant neoplasm of prostate: Secondary | ICD-10-CM | POA: Diagnosis not present

## 2016-09-16 DIAGNOSIS — Z0000 Encounter for general adult medical examination without abnormal findings: Secondary | ICD-10-CM | POA: Diagnosis not present

## 2016-09-16 DIAGNOSIS — E785 Hyperlipidemia, unspecified: Secondary | ICD-10-CM | POA: Diagnosis not present

## 2016-09-23 DIAGNOSIS — E785 Hyperlipidemia, unspecified: Secondary | ICD-10-CM | POA: Diagnosis not present

## 2016-09-23 DIAGNOSIS — Z0001 Encounter for general adult medical examination with abnormal findings: Secondary | ICD-10-CM | POA: Diagnosis not present

## 2016-09-23 DIAGNOSIS — Z23 Encounter for immunization: Secondary | ICD-10-CM | POA: Diagnosis not present

## 2016-09-23 DIAGNOSIS — R319 Hematuria, unspecified: Secondary | ICD-10-CM | POA: Diagnosis not present

## 2016-09-27 DIAGNOSIS — C44519 Basal cell carcinoma of skin of other part of trunk: Secondary | ICD-10-CM | POA: Diagnosis not present

## 2016-09-27 DIAGNOSIS — L821 Other seborrheic keratosis: Secondary | ICD-10-CM | POA: Diagnosis not present

## 2016-09-27 DIAGNOSIS — C44511 Basal cell carcinoma of skin of breast: Secondary | ICD-10-CM | POA: Diagnosis not present

## 2016-09-27 DIAGNOSIS — L570 Actinic keratosis: Secondary | ICD-10-CM | POA: Diagnosis not present

## 2016-09-27 DIAGNOSIS — Z85828 Personal history of other malignant neoplasm of skin: Secondary | ICD-10-CM | POA: Diagnosis not present

## 2016-10-21 DIAGNOSIS — F341 Dysthymic disorder: Secondary | ICD-10-CM | POA: Diagnosis not present

## 2016-10-21 DIAGNOSIS — F909 Attention-deficit hyperactivity disorder, unspecified type: Secondary | ICD-10-CM | POA: Diagnosis not present

## 2016-10-21 DIAGNOSIS — F4310 Post-traumatic stress disorder, unspecified: Secondary | ICD-10-CM | POA: Diagnosis not present

## 2016-10-21 DIAGNOSIS — F429 Obsessive-compulsive disorder, unspecified: Secondary | ICD-10-CM | POA: Diagnosis not present

## 2016-12-16 DIAGNOSIS — F332 Major depressive disorder, recurrent severe without psychotic features: Secondary | ICD-10-CM | POA: Diagnosis not present

## 2016-12-16 DIAGNOSIS — F902 Attention-deficit hyperactivity disorder, combined type: Secondary | ICD-10-CM | POA: Diagnosis not present

## 2016-12-16 DIAGNOSIS — F411 Generalized anxiety disorder: Secondary | ICD-10-CM | POA: Diagnosis not present

## 2016-12-16 DIAGNOSIS — F341 Dysthymic disorder: Secondary | ICD-10-CM | POA: Diagnosis not present

## 2016-12-16 DIAGNOSIS — F322 Major depressive disorder, single episode, severe without psychotic features: Secondary | ICD-10-CM | POA: Diagnosis not present

## 2016-12-16 DIAGNOSIS — F900 Attention-deficit hyperactivity disorder, predominantly inattentive type: Secondary | ICD-10-CM | POA: Diagnosis not present

## 2016-12-30 DIAGNOSIS — F429 Obsessive-compulsive disorder, unspecified: Secondary | ICD-10-CM | POA: Diagnosis not present

## 2016-12-30 DIAGNOSIS — F322 Major depressive disorder, single episode, severe without psychotic features: Secondary | ICD-10-CM | POA: Diagnosis not present

## 2016-12-30 DIAGNOSIS — F900 Attention-deficit hyperactivity disorder, predominantly inattentive type: Secondary | ICD-10-CM | POA: Diagnosis not present

## 2016-12-30 DIAGNOSIS — F4310 Post-traumatic stress disorder, unspecified: Secondary | ICD-10-CM | POA: Diagnosis not present

## 2017-01-13 DIAGNOSIS — F4310 Post-traumatic stress disorder, unspecified: Secondary | ICD-10-CM | POA: Diagnosis not present

## 2017-01-13 DIAGNOSIS — F411 Generalized anxiety disorder: Secondary | ICD-10-CM | POA: Diagnosis not present

## 2017-01-13 DIAGNOSIS — F322 Major depressive disorder, single episode, severe without psychotic features: Secondary | ICD-10-CM | POA: Diagnosis not present

## 2017-01-13 DIAGNOSIS — F900 Attention-deficit hyperactivity disorder, predominantly inattentive type: Secondary | ICD-10-CM | POA: Diagnosis not present

## 2017-02-03 DIAGNOSIS — F330 Major depressive disorder, recurrent, mild: Secondary | ICD-10-CM | POA: Diagnosis not present

## 2017-02-17 DIAGNOSIS — F322 Major depressive disorder, single episode, severe without psychotic features: Secondary | ICD-10-CM | POA: Diagnosis not present

## 2017-02-24 DIAGNOSIS — J301 Allergic rhinitis due to pollen: Secondary | ICD-10-CM | POA: Diagnosis not present

## 2017-03-03 DIAGNOSIS — F341 Dysthymic disorder: Secondary | ICD-10-CM | POA: Diagnosis not present

## 2017-03-03 DIAGNOSIS — F909 Attention-deficit hyperactivity disorder, unspecified type: Secondary | ICD-10-CM | POA: Diagnosis not present

## 2017-03-03 DIAGNOSIS — F330 Major depressive disorder, recurrent, mild: Secondary | ICD-10-CM | POA: Diagnosis not present

## 2017-03-08 ENCOUNTER — Encounter: Payer: Self-pay | Admitting: Internal Medicine

## 2017-03-09 DIAGNOSIS — R05 Cough: Secondary | ICD-10-CM | POA: Diagnosis not present

## 2017-03-09 DIAGNOSIS — F17290 Nicotine dependence, other tobacco product, uncomplicated: Secondary | ICD-10-CM | POA: Diagnosis not present

## 2017-03-17 DIAGNOSIS — F411 Generalized anxiety disorder: Secondary | ICD-10-CM | POA: Diagnosis not present

## 2017-03-17 DIAGNOSIS — F341 Dysthymic disorder: Secondary | ICD-10-CM | POA: Diagnosis not present

## 2017-03-17 DIAGNOSIS — F322 Major depressive disorder, single episode, severe without psychotic features: Secondary | ICD-10-CM | POA: Diagnosis not present

## 2017-03-22 DIAGNOSIS — F330 Major depressive disorder, recurrent, mild: Secondary | ICD-10-CM | POA: Diagnosis not present

## 2017-03-22 DIAGNOSIS — F331 Major depressive disorder, recurrent, moderate: Secondary | ICD-10-CM | POA: Diagnosis not present

## 2017-03-24 DIAGNOSIS — F341 Dysthymic disorder: Secondary | ICD-10-CM | POA: Diagnosis not present

## 2017-03-24 DIAGNOSIS — F331 Major depressive disorder, recurrent, moderate: Secondary | ICD-10-CM | POA: Diagnosis not present

## 2017-03-30 DIAGNOSIS — F330 Major depressive disorder, recurrent, mild: Secondary | ICD-10-CM | POA: Diagnosis not present

## 2017-04-05 DIAGNOSIS — F411 Generalized anxiety disorder: Secondary | ICD-10-CM | POA: Diagnosis not present

## 2017-04-08 DIAGNOSIS — F330 Major depressive disorder, recurrent, mild: Secondary | ICD-10-CM | POA: Diagnosis not present

## 2017-04-11 ENCOUNTER — Encounter (INDEPENDENT_AMBULATORY_CARE_PROVIDER_SITE_OTHER): Payer: Self-pay

## 2017-04-11 ENCOUNTER — Ambulatory Visit (INDEPENDENT_AMBULATORY_CARE_PROVIDER_SITE_OTHER): Payer: BLUE CROSS/BLUE SHIELD | Admitting: Internal Medicine

## 2017-04-11 ENCOUNTER — Encounter: Payer: Self-pay | Admitting: Internal Medicine

## 2017-04-11 VITALS — BP 102/68 | HR 62 | Ht 74.0 in | Wt 193.0 lb

## 2017-04-11 DIAGNOSIS — R109 Unspecified abdominal pain: Secondary | ICD-10-CM

## 2017-04-11 DIAGNOSIS — R1310 Dysphagia, unspecified: Secondary | ICD-10-CM

## 2017-04-11 NOTE — Progress Notes (Signed)
HISTORY OF PRESENT ILLNESS:  Mitchell Stone is a pleasant 26 y.o. male with significant psychiatric history who presents today regarding vague abdominal discomfort and dysphagia. He was last evaluated December 2012 regarding change in appetite and weight loss which were felt secondary to active behavioral health issues. Screening studies were negative. He has not been seen since. Tells me that he is getting married next month and had several GI complaints that he wish to have evaluated. First, they admitted abdominal discomfort or soreness infrequently noticed about a month ago. No exacerbating or relieving factors. Next, intermittent solid food dysphagia for several years. Infrequent. Last episode 3 months ago. No reflux symptoms. GI review of systems is otherwise negative. He is status post appendectomy [place] "benign tumor" in [place]. No details  REVIEW OF SYSTEMS:  All non-GI ROS negative unless otherwise stated in the history of present illness   Past Medical History:  Diagnosis Date  . ADHD (attention deficit hyperactivity disorder)   . Anxiety   . Bipolar affective disorder (HCC)   . Depression   . OCD (obsessive compulsive disorder)   . Tibia fracture    right    Past Surgical History:  Procedure Laterality Date  . APPENDECTOMY    . EYE SURGERY    . NASAL SEPTUM SURGERY      Social History Mitchell Stone  reports that he has quit smoking. His smoking use included Cigarettes. He quit after 5.00 years of use. He has never used smokeless tobacco. He reports that he does not drink alcohol or use drugs.  family history includes Breast cancer in his maternal grandmother and paternal grandmother; Colon polyps in his maternal grandmother; Coronary artery disease in his mother; Heart disease in his paternal grandfather; Liver cancer in his maternal grandfather; Pancreatic cancer in his paternal grandfather; Ulcerative colitis in his maternal uncle.  Allergies  Allergen Reactions  .  Demerol        PHYSICAL EXAMINATION: Vital signs: BP 102/68   Pulse 62   Ht 6\' 2"  (1.88 m)   Wt 193 lb (87.5 kg)   BMI 24.78 kg/m   Constitutional: generally well-appearing, no acute distress Psychiatric: alert and oriented x3, cooperative Eyes: extraocular movements intact, anicteric, conjunctiva pink Mouth: oral pharynx moist, no lesions Neck: supple no lymphadenopathy Cardiovascular: heart regular rate and rhythm, no murmur Lungs: clear to auscultation bilaterally Abdomen: soft, Slight tenderness on the abdominal wall of the umbilicus which she states is the area of concern, nondistended, no obvious ascites, no peritoneal signs, normal bowel sounds, no organomegaly Rectal:Ommitted Extremities: no clubbing cyanosis or lower extremity edema bilaterally Skin: no lesions on visible extremities Neuro: No focal deficits. Cranial nerves intact. No asterixis.    ASSESSMENT:  1. Intermittent solid food dysphagia. Rule out esophageal ring or stricture 2. Vague midabdominal discomfort. Possibly musculoskeletal based on exam today   PLAN:  1. Schedule upper endoscopy to evaluate dysphagia and abdominal discomfort.The nature of the procedure, as well as the risks, benefits, and alternatives were carefully and thoroughly reviewed with the patient. Ample time for discussion and questions allowed. The patient understood, was satisfied, and agreed to proceed. 2. Schedule abdominal ultrasound to evaluate abdominal discomfort

## 2017-04-11 NOTE — Patient Instructions (Signed)
You have been scheduled for an abdominal ultrasound at [facility] (1st floor of hospital) on 04/21/2017 at 9:00am. Please arrive 15 minutes prior to your appointment for registration. Make certain not to have anything to eat or drink after midnight prior to your appointment. Should you need to reschedule your appointment, please contact radiology at [phone]. This test typically takes about 30 minutes to perform.  You have been scheduled for an endoscopy. Please follow written instructions given to you at your visit today. If you use inhalers (even only as needed), please bring them with you on the day of your procedure. Your physician has requested that you go to www.startemmi.com and enter the access code given to you at your visit today. This web site gives a general overview about your procedure. However, you should still follow specific instructions given to you by our office regarding your preparation for the procedure.

## 2017-04-21 ENCOUNTER — Ambulatory Visit (HOSPITAL_COMMUNITY): Payer: BLUE CROSS/BLUE SHIELD | Attending: Internal Medicine

## 2017-04-29 DIAGNOSIS — F330 Major depressive disorder, recurrent, mild: Secondary | ICD-10-CM | POA: Diagnosis not present

## 2017-06-01 ENCOUNTER — Encounter: Payer: Self-pay | Admitting: Internal Medicine

## 2017-06-03 DIAGNOSIS — F330 Major depressive disorder, recurrent, mild: Secondary | ICD-10-CM | POA: Diagnosis not present

## 2017-06-07 ENCOUNTER — Telehealth: Payer: Self-pay | Admitting: Internal Medicine

## 2017-06-07 NOTE — Telephone Encounter (Signed)
Patient canceled endo procedure for Thursday 8.30.18 due to feeling ill the past couple days. Patient states he wants to heal completely and have follow up with pcp before rescheduling.

## 2017-06-07 NOTE — Telephone Encounter (Signed)
Noted  

## 2017-06-09 ENCOUNTER — Encounter: Payer: BLUE CROSS/BLUE SHIELD | Admitting: Internal Medicine

## 2017-06-10 DIAGNOSIS — F330 Major depressive disorder, recurrent, mild: Secondary | ICD-10-CM | POA: Diagnosis not present

## 2017-06-16 DIAGNOSIS — F341 Dysthymic disorder: Secondary | ICD-10-CM | POA: Diagnosis not present

## 2017-06-16 DIAGNOSIS — F909 Attention-deficit hyperactivity disorder, unspecified type: Secondary | ICD-10-CM | POA: Diagnosis not present

## 2017-06-16 DIAGNOSIS — F411 Generalized anxiety disorder: Secondary | ICD-10-CM | POA: Diagnosis not present

## 2017-06-17 DIAGNOSIS — F330 Major depressive disorder, recurrent, mild: Secondary | ICD-10-CM | POA: Diagnosis not present

## 2017-07-01 DIAGNOSIS — F330 Major depressive disorder, recurrent, mild: Secondary | ICD-10-CM | POA: Diagnosis not present

## 2017-07-07 DIAGNOSIS — F341 Dysthymic disorder: Secondary | ICD-10-CM | POA: Diagnosis not present

## 2017-07-07 DIAGNOSIS — F411 Generalized anxiety disorder: Secondary | ICD-10-CM | POA: Diagnosis not present

## 2017-07-07 DIAGNOSIS — F331 Major depressive disorder, recurrent, moderate: Secondary | ICD-10-CM | POA: Diagnosis not present

## 2017-07-08 DIAGNOSIS — F330 Major depressive disorder, recurrent, mild: Secondary | ICD-10-CM | POA: Diagnosis not present

## 2017-08-25 DIAGNOSIS — F909 Attention-deficit hyperactivity disorder, unspecified type: Secondary | ICD-10-CM | POA: Diagnosis not present

## 2017-08-25 DIAGNOSIS — F322 Major depressive disorder, single episode, severe without psychotic features: Secondary | ICD-10-CM | POA: Diagnosis not present

## 2017-08-25 DIAGNOSIS — F4310 Post-traumatic stress disorder, unspecified: Secondary | ICD-10-CM | POA: Diagnosis not present

## 2017-08-25 DIAGNOSIS — F411 Generalized anxiety disorder: Secondary | ICD-10-CM | POA: Diagnosis not present

## 2017-09-23 DIAGNOSIS — F330 Major depressive disorder, recurrent, mild: Secondary | ICD-10-CM | POA: Diagnosis not present

## 2017-10-07 DIAGNOSIS — F330 Major depressive disorder, recurrent, mild: Secondary | ICD-10-CM | POA: Diagnosis not present

## 2017-10-13 DIAGNOSIS — F4310 Post-traumatic stress disorder, unspecified: Secondary | ICD-10-CM | POA: Diagnosis not present

## 2017-10-13 DIAGNOSIS — F331 Major depressive disorder, recurrent, moderate: Secondary | ICD-10-CM | POA: Diagnosis not present

## 2017-10-13 DIAGNOSIS — F429 Obsessive-compulsive disorder, unspecified: Secondary | ICD-10-CM | POA: Diagnosis not present

## 2017-11-03 DIAGNOSIS — F4310 Post-traumatic stress disorder, unspecified: Secondary | ICD-10-CM | POA: Diagnosis not present

## 2017-11-03 DIAGNOSIS — F330 Major depressive disorder, recurrent, mild: Secondary | ICD-10-CM | POA: Diagnosis not present

## 2017-11-24 DIAGNOSIS — F331 Major depressive disorder, recurrent, moderate: Secondary | ICD-10-CM | POA: Diagnosis not present

## 2017-11-24 DIAGNOSIS — F429 Obsessive-compulsive disorder, unspecified: Secondary | ICD-10-CM | POA: Diagnosis not present

## 2017-11-24 DIAGNOSIS — F4310 Post-traumatic stress disorder, unspecified: Secondary | ICD-10-CM | POA: Diagnosis not present

## 2017-11-25 DIAGNOSIS — F330 Major depressive disorder, recurrent, mild: Secondary | ICD-10-CM | POA: Diagnosis not present

## 2017-12-09 DIAGNOSIS — F330 Major depressive disorder, recurrent, mild: Secondary | ICD-10-CM | POA: Diagnosis not present

## 2017-12-29 DIAGNOSIS — F4310 Post-traumatic stress disorder, unspecified: Secondary | ICD-10-CM | POA: Diagnosis not present

## 2017-12-29 DIAGNOSIS — F429 Obsessive-compulsive disorder, unspecified: Secondary | ICD-10-CM | POA: Diagnosis not present

## 2017-12-29 DIAGNOSIS — F3341 Major depressive disorder, recurrent, in partial remission: Secondary | ICD-10-CM | POA: Diagnosis not present

## 2018-01-03 DIAGNOSIS — F330 Major depressive disorder, recurrent, mild: Secondary | ICD-10-CM | POA: Diagnosis not present

## 2018-01-26 DIAGNOSIS — F909 Attention-deficit hyperactivity disorder, unspecified type: Secondary | ICD-10-CM | POA: Diagnosis not present

## 2018-01-26 DIAGNOSIS — F329 Major depressive disorder, single episode, unspecified: Secondary | ICD-10-CM | POA: Diagnosis not present

## 2018-01-26 DIAGNOSIS — F4310 Post-traumatic stress disorder, unspecified: Secondary | ICD-10-CM | POA: Diagnosis not present

## 2018-02-23 DIAGNOSIS — F329 Major depressive disorder, single episode, unspecified: Secondary | ICD-10-CM | POA: Diagnosis not present

## 2018-02-23 DIAGNOSIS — F4310 Post-traumatic stress disorder, unspecified: Secondary | ICD-10-CM | POA: Diagnosis not present

## 2018-04-06 DIAGNOSIS — F4310 Post-traumatic stress disorder, unspecified: Secondary | ICD-10-CM | POA: Diagnosis not present

## 2018-04-06 DIAGNOSIS — F909 Attention-deficit hyperactivity disorder, unspecified type: Secondary | ICD-10-CM | POA: Diagnosis not present

## 2018-04-06 DIAGNOSIS — F330 Major depressive disorder, recurrent, mild: Secondary | ICD-10-CM | POA: Diagnosis not present

## 2019-02-22 DIAGNOSIS — F909 Attention-deficit hyperactivity disorder, unspecified type: Secondary | ICD-10-CM | POA: Diagnosis not present

## 2019-07-19 DIAGNOSIS — F325 Major depressive disorder, single episode, in full remission: Secondary | ICD-10-CM | POA: Diagnosis not present

## 2019-10-18 DIAGNOSIS — F325 Major depressive disorder, single episode, in full remission: Secondary | ICD-10-CM | POA: Diagnosis not present

## 2019-10-18 DIAGNOSIS — F1020 Alcohol dependence, uncomplicated: Secondary | ICD-10-CM | POA: Diagnosis not present

## 2019-10-18 DIAGNOSIS — F4310 Post-traumatic stress disorder, unspecified: Secondary | ICD-10-CM | POA: Diagnosis not present

## 2020-01-31 DIAGNOSIS — F909 Attention-deficit hyperactivity disorder, unspecified type: Secondary | ICD-10-CM | POA: Diagnosis not present

## 2020-01-31 DIAGNOSIS — F1020 Alcohol dependence, uncomplicated: Secondary | ICD-10-CM | POA: Diagnosis not present

## 2020-01-31 DIAGNOSIS — F325 Major depressive disorder, single episode, in full remission: Secondary | ICD-10-CM | POA: Diagnosis not present

## 2020-01-31 DIAGNOSIS — F4310 Post-traumatic stress disorder, unspecified: Secondary | ICD-10-CM | POA: Diagnosis not present

## 2020-04-03 DIAGNOSIS — F1020 Alcohol dependence, uncomplicated: Secondary | ICD-10-CM | POA: Diagnosis not present

## 2020-04-03 DIAGNOSIS — F325 Major depressive disorder, single episode, in full remission: Secondary | ICD-10-CM | POA: Diagnosis not present

## 2020-04-03 DIAGNOSIS — F909 Attention-deficit hyperactivity disorder, unspecified type: Secondary | ICD-10-CM | POA: Diagnosis not present

## 2020-04-15 DIAGNOSIS — R112 Nausea with vomiting, unspecified: Secondary | ICD-10-CM | POA: Diagnosis not present

## 2020-04-15 DIAGNOSIS — R142 Eructation: Secondary | ICD-10-CM | POA: Diagnosis not present

## 2020-05-07 DIAGNOSIS — F4320 Adjustment disorder, unspecified: Secondary | ICD-10-CM | POA: Diagnosis not present

## 2020-05-08 DIAGNOSIS — M4802 Spinal stenosis, cervical region: Secondary | ICD-10-CM | POA: Diagnosis not present

## 2020-06-18 DIAGNOSIS — F4320 Adjustment disorder, unspecified: Secondary | ICD-10-CM | POA: Diagnosis not present

## 2020-07-03 DIAGNOSIS — F909 Attention-deficit hyperactivity disorder, unspecified type: Secondary | ICD-10-CM | POA: Diagnosis not present

## 2020-07-03 DIAGNOSIS — F325 Major depressive disorder, single episode, in full remission: Secondary | ICD-10-CM | POA: Diagnosis not present

## 2020-07-03 DIAGNOSIS — F1020 Alcohol dependence, uncomplicated: Secondary | ICD-10-CM | POA: Diagnosis not present

## 2020-07-03 DIAGNOSIS — F4310 Post-traumatic stress disorder, unspecified: Secondary | ICD-10-CM | POA: Diagnosis not present

## 2020-07-16 DIAGNOSIS — F322 Major depressive disorder, single episode, severe without psychotic features: Secondary | ICD-10-CM | POA: Diagnosis not present

## 2020-07-16 DIAGNOSIS — F1020 Alcohol dependence, uncomplicated: Secondary | ICD-10-CM | POA: Diagnosis not present

## 2020-07-16 DIAGNOSIS — F10120 Alcohol abuse with intoxication, uncomplicated: Secondary | ICD-10-CM | POA: Diagnosis not present

## 2020-07-16 DIAGNOSIS — R45851 Suicidal ideations: Secondary | ICD-10-CM | POA: Diagnosis not present

## 2020-07-16 DIAGNOSIS — T71162A Asphyxiation due to hanging, intentional self-harm, initial encounter: Secondary | ICD-10-CM | POA: Diagnosis not present

## 2020-07-16 DIAGNOSIS — Y906 Blood alcohol level of 120-199 mg/100 ml: Secondary | ICD-10-CM | POA: Diagnosis not present

## 2020-07-16 DIAGNOSIS — Z20822 Contact with and (suspected) exposure to covid-19: Secondary | ICD-10-CM | POA: Diagnosis not present

## 2020-07-16 DIAGNOSIS — X838XXA Intentional self-harm by other specified means, initial encounter: Secondary | ICD-10-CM | POA: Diagnosis not present

## 2020-07-16 DIAGNOSIS — Z87891 Personal history of nicotine dependence: Secondary | ICD-10-CM | POA: Diagnosis not present

## 2020-07-16 DIAGNOSIS — R0689 Other abnormalities of breathing: Secondary | ICD-10-CM | POA: Diagnosis not present

## 2020-07-16 DIAGNOSIS — R0902 Hypoxemia: Secondary | ICD-10-CM | POA: Diagnosis not present

## 2020-07-16 DIAGNOSIS — T1491XA Suicide attempt, initial encounter: Secondary | ICD-10-CM | POA: Diagnosis not present

## 2020-07-16 DIAGNOSIS — F29 Unspecified psychosis not due to a substance or known physiological condition: Secondary | ICD-10-CM | POA: Diagnosis not present

## 2020-07-16 DIAGNOSIS — F10929 Alcohol use, unspecified with intoxication, unspecified: Secondary | ICD-10-CM | POA: Diagnosis not present

## 2020-07-16 DIAGNOSIS — F329 Major depressive disorder, single episode, unspecified: Secondary | ICD-10-CM | POA: Diagnosis not present

## 2020-07-16 DIAGNOSIS — S1091XA Abrasion of unspecified part of neck, initial encounter: Secondary | ICD-10-CM | POA: Diagnosis not present

## 2020-07-16 DIAGNOSIS — F411 Generalized anxiety disorder: Secondary | ICD-10-CM | POA: Diagnosis not present

## 2020-07-16 DIAGNOSIS — R000 Tachycardia, unspecified: Secondary | ICD-10-CM | POA: Diagnosis not present

## 2020-07-17 DIAGNOSIS — F332 Major depressive disorder, recurrent severe without psychotic features: Secondary | ICD-10-CM | POA: Diagnosis not present

## 2020-07-17 DIAGNOSIS — F1210 Cannabis abuse, uncomplicated: Secondary | ICD-10-CM | POA: Diagnosis not present

## 2020-07-17 DIAGNOSIS — F1510 Other stimulant abuse, uncomplicated: Secondary | ICD-10-CM | POA: Diagnosis not present

## 2020-07-17 DIAGNOSIS — R45851 Suicidal ideations: Secondary | ICD-10-CM | POA: Diagnosis not present

## 2020-07-17 DIAGNOSIS — Z62810 Personal history of physical and sexual abuse in childhood: Secondary | ICD-10-CM | POA: Diagnosis not present

## 2020-07-23 DIAGNOSIS — F332 Major depressive disorder, recurrent severe without psychotic features: Secondary | ICD-10-CM | POA: Diagnosis not present

## 2020-07-23 DIAGNOSIS — F4312 Post-traumatic stress disorder, chronic: Secondary | ICD-10-CM | POA: Diagnosis not present

## 2020-07-28 DIAGNOSIS — F4312 Post-traumatic stress disorder, chronic: Secondary | ICD-10-CM | POA: Diagnosis not present

## 2020-07-31 DIAGNOSIS — F4320 Adjustment disorder, unspecified: Secondary | ICD-10-CM | POA: Diagnosis not present

## 2020-08-04 DIAGNOSIS — F4312 Post-traumatic stress disorder, chronic: Secondary | ICD-10-CM | POA: Diagnosis not present

## 2020-08-05 DIAGNOSIS — F1020 Alcohol dependence, uncomplicated: Secondary | ICD-10-CM | POA: Diagnosis not present

## 2020-08-06 DIAGNOSIS — F4312 Post-traumatic stress disorder, chronic: Secondary | ICD-10-CM | POA: Diagnosis not present

## 2020-08-07 DIAGNOSIS — G959 Disease of spinal cord, unspecified: Secondary | ICD-10-CM | POA: Diagnosis not present

## 2020-08-07 DIAGNOSIS — M4802 Spinal stenosis, cervical region: Secondary | ICD-10-CM | POA: Diagnosis not present

## 2020-08-14 DIAGNOSIS — F4312 Post-traumatic stress disorder, chronic: Secondary | ICD-10-CM | POA: Diagnosis not present

## 2020-08-18 DIAGNOSIS — F4312 Post-traumatic stress disorder, chronic: Secondary | ICD-10-CM | POA: Diagnosis not present

## 2020-08-19 DIAGNOSIS — F909 Attention-deficit hyperactivity disorder, unspecified type: Secondary | ICD-10-CM | POA: Diagnosis not present

## 2020-08-19 DIAGNOSIS — F4310 Post-traumatic stress disorder, unspecified: Secondary | ICD-10-CM | POA: Diagnosis not present

## 2020-08-19 DIAGNOSIS — F1020 Alcohol dependence, uncomplicated: Secondary | ICD-10-CM | POA: Diagnosis not present

## 2020-08-19 DIAGNOSIS — F3342 Major depressive disorder, recurrent, in full remission: Secondary | ICD-10-CM | POA: Diagnosis not present

## 2020-08-20 DIAGNOSIS — F4312 Post-traumatic stress disorder, chronic: Secondary | ICD-10-CM | POA: Diagnosis not present

## 2020-08-28 DIAGNOSIS — F4320 Adjustment disorder, unspecified: Secondary | ICD-10-CM | POA: Diagnosis not present

## 2020-08-29 DIAGNOSIS — F4312 Post-traumatic stress disorder, chronic: Secondary | ICD-10-CM | POA: Diagnosis not present

## 2020-09-08 DIAGNOSIS — H43393 Other vitreous opacities, bilateral: Secondary | ICD-10-CM | POA: Diagnosis not present

## 2020-09-08 DIAGNOSIS — H33301 Unspecified retinal break, right eye: Secondary | ICD-10-CM | POA: Diagnosis not present

## 2020-09-08 DIAGNOSIS — F4312 Post-traumatic stress disorder, chronic: Secondary | ICD-10-CM | POA: Diagnosis not present

## 2020-09-09 DIAGNOSIS — H3562 Retinal hemorrhage, left eye: Secondary | ICD-10-CM | POA: Diagnosis not present

## 2020-09-09 DIAGNOSIS — H43821 Vitreomacular adhesion, right eye: Secondary | ICD-10-CM | POA: Diagnosis not present

## 2020-09-09 DIAGNOSIS — H4312 Vitreous hemorrhage, left eye: Secondary | ICD-10-CM | POA: Diagnosis not present

## 2020-09-15 DIAGNOSIS — F4312 Post-traumatic stress disorder, chronic: Secondary | ICD-10-CM | POA: Diagnosis not present

## 2020-09-16 DIAGNOSIS — F909 Attention-deficit hyperactivity disorder, unspecified type: Secondary | ICD-10-CM | POA: Diagnosis not present

## 2020-09-16 DIAGNOSIS — F4310 Post-traumatic stress disorder, unspecified: Secondary | ICD-10-CM | POA: Diagnosis not present

## 2020-09-16 DIAGNOSIS — F3342 Major depressive disorder, recurrent, in full remission: Secondary | ICD-10-CM | POA: Diagnosis not present

## 2020-09-23 DIAGNOSIS — H43812 Vitreous degeneration, left eye: Secondary | ICD-10-CM | POA: Diagnosis not present

## 2020-09-23 DIAGNOSIS — H35372 Puckering of macula, left eye: Secondary | ICD-10-CM | POA: Diagnosis not present

## 2020-09-23 DIAGNOSIS — H43821 Vitreomacular adhesion, right eye: Secondary | ICD-10-CM | POA: Diagnosis not present

## 2020-09-23 DIAGNOSIS — H4312 Vitreous hemorrhage, left eye: Secondary | ICD-10-CM | POA: Diagnosis not present

## 2020-09-24 DIAGNOSIS — L821 Other seborrheic keratosis: Secondary | ICD-10-CM | POA: Diagnosis not present

## 2020-09-24 DIAGNOSIS — Z85828 Personal history of other malignant neoplasm of skin: Secondary | ICD-10-CM | POA: Diagnosis not present

## 2020-09-24 DIAGNOSIS — C44712 Basal cell carcinoma of skin of right lower limb, including hip: Secondary | ICD-10-CM | POA: Diagnosis not present

## 2020-09-24 DIAGNOSIS — L570 Actinic keratosis: Secondary | ICD-10-CM | POA: Diagnosis not present

## 2020-09-24 DIAGNOSIS — L111 Transient acantholytic dermatosis [Grover]: Secondary | ICD-10-CM | POA: Diagnosis not present

## 2020-09-24 DIAGNOSIS — D1801 Hemangioma of skin and subcutaneous tissue: Secondary | ICD-10-CM | POA: Diagnosis not present

## 2020-09-25 DIAGNOSIS — F4320 Adjustment disorder, unspecified: Secondary | ICD-10-CM | POA: Diagnosis not present

## 2020-09-29 DIAGNOSIS — F4312 Post-traumatic stress disorder, chronic: Secondary | ICD-10-CM | POA: Diagnosis not present

## 2020-10-20 DIAGNOSIS — F4312 Post-traumatic stress disorder, chronic: Secondary | ICD-10-CM | POA: Diagnosis not present

## 2020-10-29 DIAGNOSIS — J189 Pneumonia, unspecified organism: Secondary | ICD-10-CM | POA: Diagnosis not present

## 2020-10-29 DIAGNOSIS — R911 Solitary pulmonary nodule: Secondary | ICD-10-CM | POA: Diagnosis not present

## 2020-10-29 DIAGNOSIS — R918 Other nonspecific abnormal finding of lung field: Secondary | ICD-10-CM | POA: Diagnosis not present

## 2020-10-30 DIAGNOSIS — Z08 Encounter for follow-up examination after completed treatment for malignant neoplasm: Secondary | ICD-10-CM | POA: Diagnosis not present

## 2020-10-30 DIAGNOSIS — Z85828 Personal history of other malignant neoplasm of skin: Secondary | ICD-10-CM | POA: Diagnosis not present

## 2020-10-30 DIAGNOSIS — R911 Solitary pulmonary nodule: Secondary | ICD-10-CM | POA: Diagnosis not present

## 2020-11-03 DIAGNOSIS — F4312 Post-traumatic stress disorder, chronic: Secondary | ICD-10-CM | POA: Diagnosis not present

## 2020-11-07 DIAGNOSIS — R911 Solitary pulmonary nodule: Secondary | ICD-10-CM | POA: Diagnosis not present

## 2020-11-07 DIAGNOSIS — J189 Pneumonia, unspecified organism: Secondary | ICD-10-CM | POA: Diagnosis not present

## 2020-11-27 DIAGNOSIS — F4320 Adjustment disorder, unspecified: Secondary | ICD-10-CM | POA: Diagnosis not present

## 2021-01-27 DIAGNOSIS — S83241A Other tear of medial meniscus, current injury, right knee, initial encounter: Secondary | ICD-10-CM | POA: Diagnosis not present
